# Patient Record
Sex: Female | Born: 2000 | Race: White | Hispanic: No | Marital: Single | Smoking: Never smoker
Health system: Southern US, Community
[De-identification: ages and names within clinical notes are randomized; demographics above are authoritative.]

## PROBLEM LIST (undated history)

## (undated) DIAGNOSIS — L509 Urticaria, unspecified: Secondary | ICD-10-CM

## (undated) DIAGNOSIS — R42 Dizziness and giddiness: Secondary | ICD-10-CM

## (undated) DIAGNOSIS — L700 Acne vulgaris: Secondary | ICD-10-CM

## (undated) DIAGNOSIS — F32A Depression, unspecified: Secondary | ICD-10-CM

## (undated) DIAGNOSIS — F419 Anxiety disorder, unspecified: Secondary | ICD-10-CM

## (undated) DIAGNOSIS — IMO0001 Reserved for inherently not codable concepts without codable children: Secondary | ICD-10-CM

## (undated) DIAGNOSIS — G43009 Migraine without aura, not intractable, without status migrainosus: Secondary | ICD-10-CM

## (undated) DIAGNOSIS — Z5189 Encounter for other specified aftercare: Secondary | ICD-10-CM

## (undated) DIAGNOSIS — F988 Other specified behavioral and emotional disorders with onset usually occurring in childhood and adolescence: Secondary | ICD-10-CM

## (undated) DIAGNOSIS — F509 Eating disorder, unspecified: Secondary | ICD-10-CM

## (undated) HISTORY — DX: Other specified behavioral and emotional disorders with onset usually occurring in childhood and adolescence: F98.8

## (undated) HISTORY — PX: WISDOM TOOTH EXTRACTION: SHX21

## (undated) HISTORY — DX: Dizziness and giddiness: R42

## (undated) HISTORY — DX: Anxiety disorder, unspecified: F41.9

## (undated) HISTORY — DX: Migraine without aura, not intractable, without status migrainosus: G43.009

## (undated) HISTORY — DX: Eating disorder, unspecified: F50.9

## (undated) HISTORY — DX: Acne vulgaris: L70.0

## (undated) HISTORY — DX: Depression, unspecified: F32.A

## (undated) HISTORY — DX: Urticaria, unspecified: L50.9

---
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
## 2018-04-05 ENCOUNTER — Telehealth: Payer: Self-pay

## 2018-04-05 NOTE — Telephone Encounter (Signed)
Faxed notes to nl 

## 2018-05-02 ENCOUNTER — Ambulatory Visit: Payer: Self-pay | Admitting: Cardiovascular Disease

## 2019-02-23 ENCOUNTER — Ambulatory Visit (INDEPENDENT_AMBULATORY_CARE_PROVIDER_SITE_OTHER): Payer: Medicaid Other | Admitting: Podiatry

## 2019-02-23 ENCOUNTER — Other Ambulatory Visit: Payer: Self-pay | Admitting: Podiatry

## 2019-02-23 ENCOUNTER — Ambulatory Visit (INDEPENDENT_AMBULATORY_CARE_PROVIDER_SITE_OTHER): Payer: Medicaid Other

## 2019-02-23 ENCOUNTER — Other Ambulatory Visit: Payer: Self-pay

## 2019-02-23 ENCOUNTER — Encounter: Payer: Self-pay | Admitting: Podiatry

## 2019-02-23 DIAGNOSIS — M2141 Flat foot [pes planus] (acquired), right foot: Secondary | ICD-10-CM

## 2019-02-23 DIAGNOSIS — M2142 Flat foot [pes planus] (acquired), left foot: Secondary | ICD-10-CM | POA: Diagnosis not present

## 2019-02-23 DIAGNOSIS — M79676 Pain in unspecified toe(s): Secondary | ICD-10-CM

## 2019-02-23 DIAGNOSIS — M79671 Pain in right foot: Secondary | ICD-10-CM

## 2019-02-23 MED ORDER — DICLOFENAC SODIUM 75 MG PO TBEC: 75.0000 mg | DELAYED_RELEASE_TABLET | Freq: Two times a day (BID) | ORAL | 1 refills | Status: AC

## 2019-02-25 NOTE — Progress Notes (Signed)
   Subjective:  18 y.o. female presenting today as a new patient with a chief complaint of bilateral flat feet that have been symptomatic for the past 1-2 years. She states the pain is worse after being on her feet all day at work. She reports associated swelling and redness of the feet. She even reports aching pain at night while sleeping. She has tried OTC insoles that have not helped provide any relief. She denies any alleviating factors. Patient is here for further evaluation and treatment.   Past Medical History:  Diagnosis Date  . Blood transfusion        Objective/Physical Exam General: The patient is alert and oriented x3 in no acute distress.  Dermatology: Skin is warm, dry and supple bilateral lower extremities. Negative for open lesions or macerations.  Vascular: Bilateral lower extremity edema noted. Palpable pedal pulses bilaterally. No erythema noted. Capillary refill within normal limits.  Neurological: Epicritic and protective threshold grossly intact bilaterally.   Musculoskeletal Exam: Range of motion within normal limits to all pedal and ankle joints bilateral. Muscle strength 5/5 in all groups bilateral.  Upon weightbearing there is a medial longitudinal arch collapse bilaterally. Remove foot valgus noted to the bilateral lower extremities with excessive pronation upon mid stance.  Radiographic Exam:  Normal osseous mineralization. Joint spaces preserved. No fracture/dislocation/boney destruction.   Pes planus noted on radiographic exam lateral views. Decreased calcaneal inclination and metatarsal declination angle is noted. Anterior break in the cyma line noted on lateral views. Medial talar head to deviation noted on AP radiograph.   Assessment: 1. pes planus bilateral 2. BLE edema 3. Generalized foot pain bilaterally   Plan of Care:  1. Patient was evaluated. X-Rays reviewed.  2. Appointment with Liliane Channel, Pedorthist, for custom molded orthotics.  3.  Prescription for Diclofenac provided to patient. 4. Compression anklet dispensed.  5. Return to clinic as needed.   Works at [employer].    Edrick Kins, DPM [facility]  Dr. Edrick Kins, [place]                                        [place], [place] [address]                Office [phone]  Fax [phone]

## 2019-03-05 DIAGNOSIS — R42 Dizziness and giddiness: Secondary | ICD-10-CM | POA: Diagnosis not present

## 2019-03-05 DIAGNOSIS — R0789 Other chest pain: Secondary | ICD-10-CM | POA: Diagnosis not present

## 2019-03-06 DIAGNOSIS — Z20828 Contact with and (suspected) exposure to other viral communicable diseases: Secondary | ICD-10-CM | POA: Diagnosis not present

## 2019-03-15 DIAGNOSIS — U071 COVID-19: Secondary | ICD-10-CM | POA: Diagnosis not present

## 2019-03-21 ENCOUNTER — Other Ambulatory Visit: Payer: Medicaid Other | Admitting: Orthotics

## 2019-04-04 ENCOUNTER — Other Ambulatory Visit: Payer: Medicaid Other | Admitting: Orthotics

## 2019-04-20 ENCOUNTER — Emergency Department
Admission: EM | Admit: 2019-04-20 | Discharge: 2019-04-20 | Disposition: A | Discharge status: Home / Self Care | Payer: Medicaid Other | Attending: Student | Admitting: Student

## 2019-04-20 ENCOUNTER — Other Ambulatory Visit: Payer: Self-pay

## 2019-04-20 ENCOUNTER — Encounter: Payer: Self-pay | Admitting: Emergency Medicine

## 2019-04-20 ENCOUNTER — Emergency Department: Payer: Medicaid Other

## 2019-04-20 DIAGNOSIS — R109 Unspecified abdominal pain: Secondary | ICD-10-CM

## 2019-04-20 DIAGNOSIS — R102 Pelvic and perineal pain: Secondary | ICD-10-CM

## 2019-04-20 DIAGNOSIS — N8302 Follicular cyst of left ovary: Secondary | ICD-10-CM | POA: Diagnosis not present

## 2019-04-20 DIAGNOSIS — R1032 Left lower quadrant pain: Secondary | ICD-10-CM | POA: Diagnosis not present

## 2019-04-20 DIAGNOSIS — M25552 Pain in left hip: Secondary | ICD-10-CM | POA: Diagnosis not present

## 2019-04-20 DIAGNOSIS — N8301 Follicular cyst of right ovary: Secondary | ICD-10-CM | POA: Diagnosis not present

## 2019-04-20 DIAGNOSIS — R1110 Vomiting, unspecified: Secondary | ICD-10-CM | POA: Diagnosis not present

## 2019-04-20 DIAGNOSIS — Z793 Long term (current) use of hormonal contraceptives: Secondary | ICD-10-CM | POA: Diagnosis not present

## 2019-04-20 LAB — URINALYSIS, COMPLETE (UACMP) WITH MICROSCOPIC
Bilirubin Urine: NEGATIVE
Glucose, UA: NEGATIVE mg/dL
Hgb urine dipstick: NEGATIVE
Ketones, ur: NEGATIVE mg/dL
Leukocytes,Ua: NEGATIVE
Nitrite: NEGATIVE
Protein, ur: NEGATIVE mg/dL
Specific Gravity, Urine: 1.015 (ref 1.005–1.030)
pH: 7 (ref 5.0–8.0)

## 2019-04-20 LAB — POCT PREGNANCY, URINE: Preg Test, Ur: NEGATIVE

## 2019-04-20 MED ORDER — MELOXICAM 15 MG PO TABS: 15.0000 mg | ORAL_TABLET | Freq: Every day | ORAL | 2 refills | Status: AC

## 2019-04-20 MED ORDER — ONDANSETRON 8 MG PO TBDP
8.0000 mg | ORAL_TABLET | Freq: Once | ORAL | Status: AC
  Administered 2019-04-20: 8 mg via ORAL
  Filled 2019-04-20: qty 1

## 2019-04-20 MED ORDER — BACLOFEN 10 MG PO TABS: 10.0000 mg | ORAL_TABLET | Freq: Every day | ORAL | 1 refills | Status: AC

## 2019-04-20 NOTE — ED Notes (Signed)
See triage note  States she developed slight fever this am  Took some tylenol  Then had sharp pain to left hip area which moved into groin area    States the pain was severe enough to make her light headed  Denies trauma or urinary sx's

## 2019-04-20 NOTE — Discharge Instructions (Addendum)
Follow-up with your regular doctor if not improving in 3 to 4 days.  Return emergency department if worsening.  Take the meloxicam daily , Do not take the diclofenac with this medication,  You should also not take any Other anti-inflammatory such as Aleve or ibuprofen. If you start to see blood in your urine please return the emergency department.

## 2019-04-20 NOTE — ED Triage Notes (Signed)
Pt reports was baking in her kitchen and suddenly has some pain to the left side of her back and left hip. Pt reports the pain was so severe it made her lightheaded. Pt reports now pain is intermittent and sharp in nature. Pt reports has family hx of PCOS and endometriosis so she called her MD and they advised her to come to the ED and be checked out due to her family hx. Pt denies fevers, N, D.

## 2019-04-20 NOTE — ED Provider Notes (Signed)
[facility] Emergency Department Provider Note  ____________________________________________   First MD Initiated Contact with Patient 04/20/19 1418     (approximate)  I have reviewed the triage vital signs and the nursing notes.   HISTORY  Chief Complaint Back Pain and Hip Pain    HPI Andrea Green is a 19 y.o. female presents emergency department complaint of left lower quadrant pain.  Sudden onset today which made her drop to her knees and she vomited due to the pain.  She called her regular doctor who told her to come here as he is worried about a ovarian torsion or hemorrhagic cyst.  She states she is on the Nexplanon.  No regular periods.  No concerns for pregnancy.  No fever or chills.  Pain has dissipated somewhat now that she is here in the ED but is still uncomfortable.  She denies any vaginal discharge.  No blood in the urine.    Past Medical History:  Diagnosis Date  . Blood transfusion     Patient Active Problem List   Diagnosis Date Noted  . Episodic mood disorder (HCC) 07/24/2011  . Major depressive disorder, single episode 04/08/2011  . Chest pain 11/18/2010    Past Surgical History:  Procedure Laterality Date  . WISDOM TOOTH EXTRACTION      Prior to Admission medications   Medication Sig Start Date End Date Taking? Authorizing Provider  albuterol (PROVENTIL HFA;VENTOLIN HFA) 108 (90 Base) MCG/ACT inhaler Inhale 1-2 puffs into the lungs every 4 (four) hours as needed for wheezing or shortness of breath.    Emergency, Nurse, RN  baclofen (LIORESAL) 10 MG tablet Take 1 tablet (10 mg total) by mouth daily. 04/20/19 04/19/20  Faythe Ghee, PA-C  diclofenac (VOLTAREN) 75 MG EC tablet Take 1 tablet (75 mg total) by mouth 2 (two) times daily. 02/23/19   Felecia Shelling, DPM  etonogestrel (NEXPLANON) 68 MG IMPL implant 1 each by Subdermal route once.    Emergency, Nurse, RN  meloxicam (MOBIC) 15 MG tablet Take 1 tablet (15 mg total) by  mouth daily. 04/20/19 04/19/20  Faythe Ghee, PA-C    Allergies Aripiprazole, Doxycycline, and Zolpidem  History reviewed. No pertinent family history.  Social History Social History   Tobacco Use  . Smoking status: Never Smoker  . Smokeless tobacco: Never Used  Substance Use Topics  . Alcohol use: No  . Drug use: No    Review of Systems  Constitutional: No fever/chills Eyes: No visual changes. ENT: No sore throat. Respiratory: Denies cough Cardiovascular: Denies chest pain Gastrointestinal: Positive llq abdominal pain Genitourinary: Negative for dysuria.  Denies vaginal discharge Musculoskeletal: Negative for back pain. Skin: Negative for rash. Psychiatric: no mood changes,     ____________________________________________   PHYSICAL EXAM:  VITAL SIGNS: ED Triage Vitals  Enc Vitals Group     BP 04/20/19 1315 (!) 157/92     Pulse Rate 04/20/19 1315 98     Resp 04/20/19 1315 20     Temp 04/20/19 1315 98.4 F (36.9 C)     Temp Source 04/20/19 1315 Oral     SpO2 04/20/19 1315 99 %     Weight 04/20/19 1314 225 lb (102.1 kg)     Height 04/20/19 1314 5\' 3"  (1.6 m)     Head Circumference --      Peak Flow --      Pain Score 04/20/19 1313 8     Pain Loc --  Pain Edu? --      Excl. in GC? --     Constitutional: Alert and oriented. Well appearing and in no acute distress. Eyes: Conjunctivae are normal.  Head: Atraumatic. Nose: No congestion/rhinnorhea. Mouth/Throat: Mucous membranes are moist.   Neck:  supple no lymphadenopathy noted Cardiovascular: Normal rate, regular rhythm.  Respiratory: Normal respiratory effort.  No retractions,  Abd: soft tender in the left lower quadrant, bs normal all 4 quad GU: deferred Musculoskeletal: FROM all extremities, warm and well perfused Neurologic:  Normal speech and language.  Skin:  Skin is warm, dry and intact. No rash noted. Psychiatric: Mood and affect are normal. Speech and behavior are  normal.  ____________________________________________   LABS (all labs ordered are listed, but only abnormal results are displayed)  Labs Reviewed  URINALYSIS, COMPLETE (UACMP) WITH MICROSCOPIC - Abnormal; Notable for the following components:      Result Value   Color, Urine YELLOW (*)    APPearance HAZY (*)    Bacteria, UA RARE (*)    All other components within normal limits  POC URINE PREG, ED  POCT PREGNANCY, URINE   ____________________________________________   ____________________________________________  RADIOLOGY  Ultrasound pelvis pelvis with Doppler is negative  ____________________________________________   PROCEDURES  Procedure(s) performed: No  Procedures    ____________________________________________   INITIAL IMPRESSION / ASSESSMENT AND PLAN / ED COURSE  Pertinent labs & imaging results that were available during my care of the patient were reviewed by me and considered in my medical decision making (see chart for details).   Patient is 19 year old female presents emergency department with left lower quadrant pain.  See HPI  Physical exam shows patient appears well.  Left lower quadrant is tender to palpation.  UA, POC pregnancy Ultrasound pelvis, ultrasound pelvis with Doppler to rule out hemorrhagic/ torsion    Ultrasound is negative.  Explained findings to the patient.  She was given a prescription for meloxicam and baclofen.  She is to stop taking the diclofenac.  Return to the emergency department if worsening.  Follow-up with her regular doctor if not improving in 3 to 4 days.  She states she understands will comply.  She was discharged stable condition and given a work note.  Andrea Green was evaluated in Emergency Department on 04/20/2019 for the symptoms described in the history of present illness. She was evaluated in the context of the global COVID-19 pandemic, which necessitated consideration that the patient might be at risk for  infection with the SARS-CoV-2 virus that causes COVID-19. Institutional protocols and algorithms that pertain to the evaluation of patients at risk for COVID-19 are in a state of rapid change based on information released by regulatory bodies including the CDC and federal and state organizations. These policies and algorithms were followed during the patient's care in the ED.   As part of my medical decision making, I reviewed the following data within the electronic MEDICAL RECORD NUMBER Nursing notes reviewed and incorporated, Labs reviewed UA and poc preg negative, Old chart reviewed, Radiograph reviewed still negative, Notes from prior ED visits and [place] Controlled Substance Database  ____________________________________________   FINAL CLINICAL IMPRESSION(S) / ED DIAGNOSES  Final diagnoses:  LLQ pain  Acute flank pain      NEW MEDICATIONS STARTED DURING THIS VISIT:  New Prescriptions   BACLOFEN (LIORESAL) 10 MG TABLET    Take 1 tablet (10 mg total) by mouth daily.   MELOXICAM (MOBIC) 15 MG TABLET    Take 1 tablet (15 mg  total) by mouth daily.     Note:  This document was prepared using Dragon voice recognition software and may include unintentional dictation errors.    Versie Starks, PA-C 04/20/19 1657    Lilia Pro., MD 04/21/19 [phone]

## 2019-04-24 DIAGNOSIS — M545 Low back pain: Secondary | ICD-10-CM | POA: Diagnosis not present

## 2019-04-24 DIAGNOSIS — R1110 Vomiting, unspecified: Secondary | ICD-10-CM | POA: Diagnosis not present

## 2019-04-24 DIAGNOSIS — R1031 Right lower quadrant pain: Secondary | ICD-10-CM | POA: Diagnosis not present

## 2019-04-24 DIAGNOSIS — F329 Major depressive disorder, single episode, unspecified: Secondary | ICD-10-CM | POA: Diagnosis not present

## 2019-04-24 DIAGNOSIS — N200 Calculus of kidney: Secondary | ICD-10-CM | POA: Diagnosis not present

## 2019-04-24 DIAGNOSIS — R1033 Periumbilical pain: Secondary | ICD-10-CM | POA: Diagnosis not present

## 2019-04-24 DIAGNOSIS — R1013 Epigastric pain: Secondary | ICD-10-CM | POA: Diagnosis not present

## 2019-04-24 DIAGNOSIS — N132 Hydronephrosis with renal and ureteral calculous obstruction: Secondary | ICD-10-CM | POA: Diagnosis not present

## 2019-04-24 DIAGNOSIS — F909 Attention-deficit hyperactivity disorder, unspecified type: Secondary | ICD-10-CM | POA: Diagnosis not present

## 2019-04-24 DIAGNOSIS — R10819 Abdominal tenderness, unspecified site: Secondary | ICD-10-CM | POA: Diagnosis not present

## 2019-04-24 DIAGNOSIS — F419 Anxiety disorder, unspecified: Secondary | ICD-10-CM | POA: Diagnosis not present

## 2019-04-26 DIAGNOSIS — F411 Generalized anxiety disorder: Secondary | ICD-10-CM | POA: Diagnosis not present

## 2019-05-03 DIAGNOSIS — F411 Generalized anxiety disorder: Secondary | ICD-10-CM | POA: Diagnosis not present

## 2019-05-10 DIAGNOSIS — F603 Borderline personality disorder: Secondary | ICD-10-CM | POA: Diagnosis not present

## 2019-05-11 DIAGNOSIS — N200 Calculus of kidney: Secondary | ICD-10-CM | POA: Diagnosis not present

## 2019-05-22 DIAGNOSIS — F603 Borderline personality disorder: Secondary | ICD-10-CM | POA: Diagnosis not present

## 2019-05-23 DIAGNOSIS — F603 Borderline personality disorder: Secondary | ICD-10-CM | POA: Diagnosis not present

## 2019-05-28 DIAGNOSIS — N1330 Unspecified hydronephrosis: Secondary | ICD-10-CM | POA: Diagnosis not present

## 2019-05-28 DIAGNOSIS — N200 Calculus of kidney: Secondary | ICD-10-CM | POA: Diagnosis not present

## 2019-05-28 DIAGNOSIS — N134 Hydroureter: Secondary | ICD-10-CM | POA: Diagnosis not present

## 2019-05-28 DIAGNOSIS — N2889 Other specified disorders of kidney and ureter: Secondary | ICD-10-CM | POA: Diagnosis not present

## 2019-05-28 DIAGNOSIS — N132 Hydronephrosis with renal and ureteral calculous obstruction: Secondary | ICD-10-CM | POA: Diagnosis not present

## 2019-06-01 DIAGNOSIS — F603 Borderline personality disorder: Secondary | ICD-10-CM | POA: Diagnosis not present

## 2019-06-02 DIAGNOSIS — Z01812 Encounter for preprocedural laboratory examination: Secondary | ICD-10-CM | POA: Diagnosis not present

## 2019-06-02 DIAGNOSIS — Z20822 Contact with and (suspected) exposure to covid-19: Secondary | ICD-10-CM | POA: Diagnosis not present

## 2019-06-04 DIAGNOSIS — F329 Major depressive disorder, single episode, unspecified: Secondary | ICD-10-CM | POA: Diagnosis not present

## 2019-06-04 DIAGNOSIS — N135 Crossing vessel and stricture of ureter without hydronephrosis: Secondary | ICD-10-CM | POA: Diagnosis not present

## 2019-06-04 DIAGNOSIS — D72829 Elevated white blood cell count, unspecified: Secondary | ICD-10-CM | POA: Diagnosis not present

## 2019-06-04 DIAGNOSIS — N201 Calculus of ureter: Secondary | ICD-10-CM | POA: Diagnosis not present

## 2019-06-04 DIAGNOSIS — N200 Calculus of kidney: Secondary | ICD-10-CM | POA: Diagnosis not present

## 2019-06-07 DIAGNOSIS — F603 Borderline personality disorder: Secondary | ICD-10-CM | POA: Diagnosis not present

## 2019-06-09 DIAGNOSIS — R110 Nausea: Secondary | ICD-10-CM | POA: Diagnosis not present

## 2019-06-09 DIAGNOSIS — T82847A Pain from cardiac prosthetic devices, implants and grafts, initial encounter: Secondary | ICD-10-CM | POA: Diagnosis not present

## 2019-06-09 DIAGNOSIS — F909 Attention-deficit hyperactivity disorder, unspecified type: Secondary | ICD-10-CM | POA: Diagnosis not present

## 2019-06-09 DIAGNOSIS — N132 Hydronephrosis with renal and ureteral calculous obstruction: Secondary | ICD-10-CM | POA: Diagnosis not present

## 2019-06-09 DIAGNOSIS — R1031 Right lower quadrant pain: Secondary | ICD-10-CM | POA: Diagnosis not present

## 2019-06-09 DIAGNOSIS — I10 Essential (primary) hypertension: Secondary | ICD-10-CM | POA: Diagnosis not present

## 2019-06-09 DIAGNOSIS — R319 Hematuria, unspecified: Secondary | ICD-10-CM | POA: Diagnosis not present

## 2019-06-09 DIAGNOSIS — R1011 Right upper quadrant pain: Secondary | ICD-10-CM | POA: Diagnosis not present

## 2019-06-14 DIAGNOSIS — F603 Borderline personality disorder: Secondary | ICD-10-CM | POA: Diagnosis not present

## 2019-06-28 DIAGNOSIS — F603 Borderline personality disorder: Secondary | ICD-10-CM | POA: Diagnosis not present

## 2019-07-05 DIAGNOSIS — F603 Borderline personality disorder: Secondary | ICD-10-CM | POA: Diagnosis not present

## 2019-07-12 DIAGNOSIS — F603 Borderline personality disorder: Secondary | ICD-10-CM | POA: Diagnosis not present

## 2019-07-18 DIAGNOSIS — F603 Borderline personality disorder: Secondary | ICD-10-CM | POA: Diagnosis not present

## 2019-07-26 DIAGNOSIS — F603 Borderline personality disorder: Secondary | ICD-10-CM | POA: Diagnosis not present

## 2019-07-30 DIAGNOSIS — Z87442 Personal history of urinary calculi: Secondary | ICD-10-CM | POA: Diagnosis not present

## 2019-07-30 DIAGNOSIS — N200 Calculus of kidney: Secondary | ICD-10-CM | POA: Diagnosis not present

## 2019-07-30 DIAGNOSIS — R5383 Other fatigue: Secondary | ICD-10-CM | POA: Diagnosis not present

## 2019-08-03 DIAGNOSIS — Z23 Encounter for immunization: Secondary | ICD-10-CM | POA: Diagnosis not present

## 2019-08-09 DIAGNOSIS — F603 Borderline personality disorder: Secondary | ICD-10-CM | POA: Diagnosis not present

## 2019-08-10 DIAGNOSIS — F603 Borderline personality disorder: Secondary | ICD-10-CM | POA: Diagnosis not present

## 2019-08-12 DIAGNOSIS — F603 Borderline personality disorder: Secondary | ICD-10-CM | POA: Diagnosis not present

## 2019-08-16 DIAGNOSIS — F331 Major depressive disorder, recurrent, moderate: Secondary | ICD-10-CM | POA: Diagnosis not present

## 2019-08-16 DIAGNOSIS — F603 Borderline personality disorder: Secondary | ICD-10-CM | POA: Diagnosis not present

## 2019-08-16 DIAGNOSIS — F411 Generalized anxiety disorder: Secondary | ICD-10-CM | POA: Diagnosis not present

## 2019-08-16 DIAGNOSIS — F4310 Post-traumatic stress disorder, unspecified: Secondary | ICD-10-CM | POA: Diagnosis not present

## 2019-08-30 DIAGNOSIS — F4310 Post-traumatic stress disorder, unspecified: Secondary | ICD-10-CM | POA: Diagnosis not present

## 2019-08-30 DIAGNOSIS — F331 Major depressive disorder, recurrent, moderate: Secondary | ICD-10-CM | POA: Diagnosis not present

## 2019-08-30 DIAGNOSIS — F603 Borderline personality disorder: Secondary | ICD-10-CM | POA: Diagnosis not present

## 2019-08-30 DIAGNOSIS — F411 Generalized anxiety disorder: Secondary | ICD-10-CM | POA: Diagnosis not present

## 2019-08-31 DIAGNOSIS — Z23 Encounter for immunization: Secondary | ICD-10-CM | POA: Diagnosis not present

## 2019-09-06 DIAGNOSIS — F4310 Post-traumatic stress disorder, unspecified: Secondary | ICD-10-CM | POA: Diagnosis not present

## 2019-09-06 DIAGNOSIS — F411 Generalized anxiety disorder: Secondary | ICD-10-CM | POA: Diagnosis not present

## 2019-09-06 DIAGNOSIS — F603 Borderline personality disorder: Secondary | ICD-10-CM | POA: Diagnosis not present

## 2019-09-06 DIAGNOSIS — F331 Major depressive disorder, recurrent, moderate: Secondary | ICD-10-CM | POA: Diagnosis not present

## 2019-09-13 DIAGNOSIS — F411 Generalized anxiety disorder: Secondary | ICD-10-CM | POA: Diagnosis not present

## 2019-09-13 DIAGNOSIS — F603 Borderline personality disorder: Secondary | ICD-10-CM | POA: Diagnosis not present

## 2019-09-13 DIAGNOSIS — F4310 Post-traumatic stress disorder, unspecified: Secondary | ICD-10-CM | POA: Diagnosis not present

## 2019-09-13 DIAGNOSIS — F331 Major depressive disorder, recurrent, moderate: Secondary | ICD-10-CM | POA: Diagnosis not present

## 2019-09-20 DIAGNOSIS — F603 Borderline personality disorder: Secondary | ICD-10-CM | POA: Diagnosis not present

## 2019-09-20 DIAGNOSIS — F411 Generalized anxiety disorder: Secondary | ICD-10-CM | POA: Diagnosis not present

## 2019-09-20 DIAGNOSIS — F4310 Post-traumatic stress disorder, unspecified: Secondary | ICD-10-CM | POA: Diagnosis not present

## 2019-09-20 DIAGNOSIS — F331 Major depressive disorder, recurrent, moderate: Secondary | ICD-10-CM | POA: Diagnosis not present

## 2019-10-02 DIAGNOSIS — F4310 Post-traumatic stress disorder, unspecified: Secondary | ICD-10-CM | POA: Diagnosis not present

## 2019-10-02 DIAGNOSIS — F411 Generalized anxiety disorder: Secondary | ICD-10-CM | POA: Diagnosis not present

## 2019-10-02 DIAGNOSIS — F331 Major depressive disorder, recurrent, moderate: Secondary | ICD-10-CM | POA: Diagnosis not present

## 2019-10-02 DIAGNOSIS — F603 Borderline personality disorder: Secondary | ICD-10-CM | POA: Diagnosis not present

## 2019-10-04 DIAGNOSIS — F4310 Post-traumatic stress disorder, unspecified: Secondary | ICD-10-CM | POA: Diagnosis not present

## 2019-10-04 DIAGNOSIS — F411 Generalized anxiety disorder: Secondary | ICD-10-CM | POA: Diagnosis not present

## 2019-10-04 DIAGNOSIS — F331 Major depressive disorder, recurrent, moderate: Secondary | ICD-10-CM | POA: Diagnosis not present

## 2019-10-04 DIAGNOSIS — F603 Borderline personality disorder: Secondary | ICD-10-CM | POA: Diagnosis not present

## 2019-10-11 DIAGNOSIS — F411 Generalized anxiety disorder: Secondary | ICD-10-CM | POA: Diagnosis not present

## 2019-10-11 DIAGNOSIS — F331 Major depressive disorder, recurrent, moderate: Secondary | ICD-10-CM | POA: Diagnosis not present

## 2019-10-11 DIAGNOSIS — F4310 Post-traumatic stress disorder, unspecified: Secondary | ICD-10-CM | POA: Diagnosis not present

## 2019-10-11 DIAGNOSIS — F603 Borderline personality disorder: Secondary | ICD-10-CM | POA: Diagnosis not present

## 2019-10-18 DIAGNOSIS — F331 Major depressive disorder, recurrent, moderate: Secondary | ICD-10-CM | POA: Diagnosis not present

## 2019-10-18 DIAGNOSIS — F603 Borderline personality disorder: Secondary | ICD-10-CM | POA: Diagnosis not present

## 2019-10-18 DIAGNOSIS — F4310 Post-traumatic stress disorder, unspecified: Secondary | ICD-10-CM | POA: Diagnosis not present

## 2019-10-18 DIAGNOSIS — F411 Generalized anxiety disorder: Secondary | ICD-10-CM | POA: Diagnosis not present

## 2019-10-25 DIAGNOSIS — F411 Generalized anxiety disorder: Secondary | ICD-10-CM | POA: Diagnosis not present

## 2019-10-25 DIAGNOSIS — F331 Major depressive disorder, recurrent, moderate: Secondary | ICD-10-CM | POA: Diagnosis not present

## 2019-10-25 DIAGNOSIS — F603 Borderline personality disorder: Secondary | ICD-10-CM | POA: Diagnosis not present

## 2019-10-25 DIAGNOSIS — F4310 Post-traumatic stress disorder, unspecified: Secondary | ICD-10-CM | POA: Diagnosis not present

## 2019-10-29 DIAGNOSIS — F411 Generalized anxiety disorder: Secondary | ICD-10-CM | POA: Diagnosis not present

## 2019-10-29 DIAGNOSIS — F4310 Post-traumatic stress disorder, unspecified: Secondary | ICD-10-CM | POA: Diagnosis not present

## 2019-10-29 DIAGNOSIS — F331 Major depressive disorder, recurrent, moderate: Secondary | ICD-10-CM | POA: Diagnosis not present

## 2019-10-29 DIAGNOSIS — F988 Other specified behavioral and emotional disorders with onset usually occurring in childhood and adolescence: Secondary | ICD-10-CM | POA: Diagnosis not present

## 2019-10-29 DIAGNOSIS — F603 Borderline personality disorder: Secondary | ICD-10-CM | POA: Diagnosis not present

## 2019-11-01 DIAGNOSIS — F331 Major depressive disorder, recurrent, moderate: Secondary | ICD-10-CM | POA: Diagnosis not present

## 2019-11-01 DIAGNOSIS — F411 Generalized anxiety disorder: Secondary | ICD-10-CM | POA: Diagnosis not present

## 2019-11-01 DIAGNOSIS — F4310 Post-traumatic stress disorder, unspecified: Secondary | ICD-10-CM | POA: Diagnosis not present

## 2019-11-01 DIAGNOSIS — F603 Borderline personality disorder: Secondary | ICD-10-CM | POA: Diagnosis not present

## 2019-11-04 ENCOUNTER — Encounter (HOSPITAL_COMMUNITY): Payer: Self-pay

## 2019-11-04 ENCOUNTER — Ambulatory Visit (HOSPITAL_COMMUNITY)
Admission: EM | Admit: 2019-11-04 | Discharge: 2019-11-04 | Disposition: A | Discharge status: Home / Self Care | Payer: Medicaid Other

## 2019-11-04 ENCOUNTER — Emergency Department (HOSPITAL_COMMUNITY)
Admission: EM | Admit: 2019-11-04 | Discharge: 2019-11-04 | Disposition: A | Discharge status: Home / Self Care | Payer: Medicaid Other | Attending: Emergency Medicine | Admitting: Emergency Medicine

## 2019-11-04 ENCOUNTER — Emergency Department (HOSPITAL_COMMUNITY): Payer: Medicaid Other

## 2019-11-04 ENCOUNTER — Other Ambulatory Visit: Payer: Self-pay

## 2019-11-04 DIAGNOSIS — S161XXA Strain of muscle, fascia and tendon at neck level, initial encounter: Secondary | ICD-10-CM | POA: Diagnosis not present

## 2019-11-04 DIAGNOSIS — Y939 Activity, unspecified: Secondary | ICD-10-CM | POA: Diagnosis not present

## 2019-11-04 DIAGNOSIS — S199XXA Unspecified injury of neck, initial encounter: Secondary | ICD-10-CM | POA: Diagnosis present

## 2019-11-04 DIAGNOSIS — R030 Elevated blood-pressure reading, without diagnosis of hypertension: Secondary | ICD-10-CM

## 2019-11-04 DIAGNOSIS — R519 Headache, unspecified: Secondary | ICD-10-CM | POA: Diagnosis not present

## 2019-11-04 DIAGNOSIS — Y92410 Unspecified street and highway as the place of occurrence of the external cause: Secondary | ICD-10-CM | POA: Diagnosis not present

## 2019-11-04 DIAGNOSIS — S060X0A Concussion without loss of consciousness, initial encounter: Secondary | ICD-10-CM

## 2019-11-04 DIAGNOSIS — Y999 Unspecified external cause status: Secondary | ICD-10-CM | POA: Insufficient documentation

## 2019-11-04 DIAGNOSIS — M542 Cervicalgia: Secondary | ICD-10-CM | POA: Diagnosis not present

## 2019-11-04 MED ORDER — IBUPROFEN 200 MG PO TABS
400.0000 mg | ORAL_TABLET | Freq: Once | ORAL | Status: AC
  Administered 2019-11-04: 400 mg via ORAL
  Filled 2019-11-04: qty 2

## 2019-11-04 MED ORDER — ONDANSETRON 8 MG PO TBDP: 8.0000 mg | ORAL_TABLET | Freq: Three times a day (TID) | ORAL | 0 refills | Status: AC | PRN

## 2019-11-04 MED ORDER — METHOCARBAMOL 750 MG PO TABS: 750.0000 mg | ORAL_TABLET | Freq: Three times a day (TID) | ORAL | 0 refills | Status: AC | PRN

## 2019-11-04 NOTE — Discharge Instructions (Addendum)
It was our pleasure to provide your ER care today - we hope that you feel better.  Your ct scans look good.   Rest. Drink plenty of fluids. Take acetaminophen or ibuprofen as need. You may also try robaxin as need for muscle pain/spasm - no driving when taking. Take zofran as need for nausea.   No contact sports until cleared to do so by your doctor.   Follow up with primary care doctor in 1-2 weeks for recheck - also have your blood pressure rechecked then as it is high today.  Return to ER if worse, new symptoms, fevers, new or severe pain, numbness/weakness, or other concern.
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
## 2019-11-04 NOTE — ED Notes (Signed)
Patient is being discharged from the Urgent Care and sent to the Emergency Department via POV. Per Marilynn Rail patient is in need of higher level of care due to Possible head injury. Patient is aware and verbalizes understanding of plan of care.  Vitals:   11/04/19 1341  BP: (!) 141/81  Pulse: (!) 108  Resp: 18  Temp: 98.2 F (36.8 C)  SpO2: 99%
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
## 2019-11-13 DIAGNOSIS — F331 Major depressive disorder, recurrent, moderate: Secondary | ICD-10-CM | POA: Diagnosis not present

## 2019-11-13 DIAGNOSIS — F603 Borderline personality disorder: Secondary | ICD-10-CM | POA: Diagnosis not present

## 2019-11-13 DIAGNOSIS — F411 Generalized anxiety disorder: Secondary | ICD-10-CM | POA: Diagnosis not present

## 2019-11-13 DIAGNOSIS — F4310 Post-traumatic stress disorder, unspecified: Secondary | ICD-10-CM | POA: Diagnosis not present

## 2019-11-20 DIAGNOSIS — F4310 Post-traumatic stress disorder, unspecified: Secondary | ICD-10-CM | POA: Diagnosis not present

## 2019-11-20 DIAGNOSIS — F331 Major depressive disorder, recurrent, moderate: Secondary | ICD-10-CM | POA: Diagnosis not present

## 2019-11-20 DIAGNOSIS — F411 Generalized anxiety disorder: Secondary | ICD-10-CM | POA: Diagnosis not present

## 2019-11-20 DIAGNOSIS — F603 Borderline personality disorder: Secondary | ICD-10-CM | POA: Diagnosis not present

## 2019-11-27 DIAGNOSIS — F411 Generalized anxiety disorder: Secondary | ICD-10-CM | POA: Diagnosis not present

## 2019-11-27 DIAGNOSIS — F4310 Post-traumatic stress disorder, unspecified: Secondary | ICD-10-CM | POA: Diagnosis not present

## 2019-11-27 DIAGNOSIS — F331 Major depressive disorder, recurrent, moderate: Secondary | ICD-10-CM | POA: Diagnosis not present

## 2019-11-27 DIAGNOSIS — F603 Borderline personality disorder: Secondary | ICD-10-CM | POA: Diagnosis not present

## 2019-12-04 DIAGNOSIS — F411 Generalized anxiety disorder: Secondary | ICD-10-CM | POA: Diagnosis not present

## 2019-12-04 DIAGNOSIS — F4310 Post-traumatic stress disorder, unspecified: Secondary | ICD-10-CM | POA: Diagnosis not present

## 2019-12-04 DIAGNOSIS — F331 Major depressive disorder, recurrent, moderate: Secondary | ICD-10-CM | POA: Diagnosis not present

## 2019-12-04 DIAGNOSIS — F603 Borderline personality disorder: Secondary | ICD-10-CM | POA: Diagnosis not present

## 2019-12-06 DIAGNOSIS — F331 Major depressive disorder, recurrent, moderate: Secondary | ICD-10-CM | POA: Diagnosis not present

## 2019-12-06 DIAGNOSIS — F411 Generalized anxiety disorder: Secondary | ICD-10-CM | POA: Diagnosis not present

## 2019-12-06 DIAGNOSIS — F4310 Post-traumatic stress disorder, unspecified: Secondary | ICD-10-CM | POA: Diagnosis not present

## 2019-12-06 DIAGNOSIS — F603 Borderline personality disorder: Secondary | ICD-10-CM | POA: Diagnosis not present

## 2019-12-11 DIAGNOSIS — F4310 Post-traumatic stress disorder, unspecified: Secondary | ICD-10-CM | POA: Diagnosis not present

## 2019-12-11 DIAGNOSIS — F331 Major depressive disorder, recurrent, moderate: Secondary | ICD-10-CM | POA: Diagnosis not present

## 2019-12-11 DIAGNOSIS — F509 Eating disorder, unspecified: Secondary | ICD-10-CM | POA: Diagnosis not present

## 2019-12-11 DIAGNOSIS — F411 Generalized anxiety disorder: Secondary | ICD-10-CM | POA: Diagnosis not present

## 2019-12-11 DIAGNOSIS — F603 Borderline personality disorder: Secondary | ICD-10-CM | POA: Diagnosis not present

## 2019-12-18 DIAGNOSIS — F331 Major depressive disorder, recurrent, moderate: Secondary | ICD-10-CM | POA: Diagnosis not present

## 2019-12-18 DIAGNOSIS — F4310 Post-traumatic stress disorder, unspecified: Secondary | ICD-10-CM | POA: Diagnosis not present

## 2019-12-18 DIAGNOSIS — F509 Eating disorder, unspecified: Secondary | ICD-10-CM | POA: Diagnosis not present

## 2019-12-18 DIAGNOSIS — F411 Generalized anxiety disorder: Secondary | ICD-10-CM | POA: Diagnosis not present

## 2019-12-18 DIAGNOSIS — F603 Borderline personality disorder: Secondary | ICD-10-CM | POA: Diagnosis not present

## 2019-12-25 DIAGNOSIS — F331 Major depressive disorder, recurrent, moderate: Secondary | ICD-10-CM | POA: Diagnosis not present

## 2019-12-25 DIAGNOSIS — F411 Generalized anxiety disorder: Secondary | ICD-10-CM | POA: Diagnosis not present

## 2019-12-25 DIAGNOSIS — F4310 Post-traumatic stress disorder, unspecified: Secondary | ICD-10-CM | POA: Diagnosis not present

## 2019-12-25 DIAGNOSIS — F603 Borderline personality disorder: Secondary | ICD-10-CM | POA: Diagnosis not present

## 2019-12-25 DIAGNOSIS — F509 Eating disorder, unspecified: Secondary | ICD-10-CM | POA: Diagnosis not present

## 2020-01-01 DIAGNOSIS — F411 Generalized anxiety disorder: Secondary | ICD-10-CM | POA: Diagnosis not present

## 2020-01-01 DIAGNOSIS — F331 Major depressive disorder, recurrent, moderate: Secondary | ICD-10-CM | POA: Diagnosis not present

## 2020-01-01 DIAGNOSIS — F4310 Post-traumatic stress disorder, unspecified: Secondary | ICD-10-CM | POA: Diagnosis not present

## 2020-01-01 DIAGNOSIS — F603 Borderline personality disorder: Secondary | ICD-10-CM | POA: Diagnosis not present

## 2020-01-01 DIAGNOSIS — F509 Eating disorder, unspecified: Secondary | ICD-10-CM | POA: Diagnosis not present

## 2020-01-18 DIAGNOSIS — F4310 Post-traumatic stress disorder, unspecified: Secondary | ICD-10-CM | POA: Diagnosis not present

## 2020-01-18 DIAGNOSIS — F411 Generalized anxiety disorder: Secondary | ICD-10-CM | POA: Diagnosis not present

## 2020-01-18 DIAGNOSIS — F603 Borderline personality disorder: Secondary | ICD-10-CM | POA: Diagnosis not present

## 2020-01-18 DIAGNOSIS — F509 Eating disorder, unspecified: Secondary | ICD-10-CM | POA: Diagnosis not present

## 2020-01-18 DIAGNOSIS — F331 Major depressive disorder, recurrent, moderate: Secondary | ICD-10-CM | POA: Diagnosis not present

## 2020-01-22 DIAGNOSIS — F509 Eating disorder, unspecified: Secondary | ICD-10-CM | POA: Diagnosis not present

## 2020-01-22 DIAGNOSIS — F331 Major depressive disorder, recurrent, moderate: Secondary | ICD-10-CM | POA: Diagnosis not present

## 2020-01-22 DIAGNOSIS — F603 Borderline personality disorder: Secondary | ICD-10-CM | POA: Diagnosis not present

## 2020-01-22 DIAGNOSIS — F4310 Post-traumatic stress disorder, unspecified: Secondary | ICD-10-CM | POA: Diagnosis not present

## 2020-01-22 DIAGNOSIS — F411 Generalized anxiety disorder: Secondary | ICD-10-CM | POA: Diagnosis not present

## 2020-01-25 DIAGNOSIS — F4310 Post-traumatic stress disorder, unspecified: Secondary | ICD-10-CM | POA: Diagnosis not present

## 2020-01-25 DIAGNOSIS — F509 Eating disorder, unspecified: Secondary | ICD-10-CM | POA: Diagnosis not present

## 2020-01-25 DIAGNOSIS — F603 Borderline personality disorder: Secondary | ICD-10-CM | POA: Diagnosis not present

## 2020-01-25 DIAGNOSIS — F331 Major depressive disorder, recurrent, moderate: Secondary | ICD-10-CM | POA: Diagnosis not present

## 2020-01-25 DIAGNOSIS — F411 Generalized anxiety disorder: Secondary | ICD-10-CM | POA: Diagnosis not present

## 2020-02-05 DIAGNOSIS — F603 Borderline personality disorder: Secondary | ICD-10-CM | POA: Diagnosis not present

## 2020-02-05 DIAGNOSIS — F509 Eating disorder, unspecified: Secondary | ICD-10-CM | POA: Diagnosis not present

## 2020-02-05 DIAGNOSIS — F4310 Post-traumatic stress disorder, unspecified: Secondary | ICD-10-CM | POA: Diagnosis not present

## 2020-02-05 DIAGNOSIS — F411 Generalized anxiety disorder: Secondary | ICD-10-CM | POA: Diagnosis not present

## 2020-02-05 DIAGNOSIS — F331 Major depressive disorder, recurrent, moderate: Secondary | ICD-10-CM | POA: Diagnosis not present

## 2020-02-22 DIAGNOSIS — F331 Major depressive disorder, recurrent, moderate: Secondary | ICD-10-CM | POA: Diagnosis not present

## 2020-02-22 DIAGNOSIS — F4310 Post-traumatic stress disorder, unspecified: Secondary | ICD-10-CM | POA: Diagnosis not present

## 2020-02-22 DIAGNOSIS — F603 Borderline personality disorder: Secondary | ICD-10-CM | POA: Diagnosis not present

## 2020-02-22 DIAGNOSIS — F509 Eating disorder, unspecified: Secondary | ICD-10-CM | POA: Diagnosis not present

## 2020-02-22 DIAGNOSIS — F411 Generalized anxiety disorder: Secondary | ICD-10-CM | POA: Diagnosis not present

## 2020-02-24 DIAGNOSIS — F4310 Post-traumatic stress disorder, unspecified: Secondary | ICD-10-CM | POA: Diagnosis not present

## 2020-02-24 DIAGNOSIS — F411 Generalized anxiety disorder: Secondary | ICD-10-CM | POA: Diagnosis not present

## 2020-02-24 DIAGNOSIS — F603 Borderline personality disorder: Secondary | ICD-10-CM | POA: Diagnosis not present

## 2020-02-24 DIAGNOSIS — F331 Major depressive disorder, recurrent, moderate: Secondary | ICD-10-CM | POA: Diagnosis not present

## 2020-02-24 DIAGNOSIS — F509 Eating disorder, unspecified: Secondary | ICD-10-CM | POA: Diagnosis not present

## 2020-02-26 DIAGNOSIS — F4310 Post-traumatic stress disorder, unspecified: Secondary | ICD-10-CM | POA: Diagnosis not present

## 2020-02-26 DIAGNOSIS — F331 Major depressive disorder, recurrent, moderate: Secondary | ICD-10-CM | POA: Diagnosis not present

## 2020-02-26 DIAGNOSIS — F411 Generalized anxiety disorder: Secondary | ICD-10-CM | POA: Diagnosis not present

## 2020-02-26 DIAGNOSIS — F509 Eating disorder, unspecified: Secondary | ICD-10-CM | POA: Diagnosis not present

## 2020-03-04 DIAGNOSIS — F331 Major depressive disorder, recurrent, moderate: Secondary | ICD-10-CM | POA: Diagnosis not present

## 2020-03-04 DIAGNOSIS — F509 Eating disorder, unspecified: Secondary | ICD-10-CM | POA: Diagnosis not present

## 2020-03-04 DIAGNOSIS — F4310 Post-traumatic stress disorder, unspecified: Secondary | ICD-10-CM | POA: Diagnosis not present

## 2020-03-04 DIAGNOSIS — F603 Borderline personality disorder: Secondary | ICD-10-CM | POA: Diagnosis not present

## 2020-03-04 DIAGNOSIS — F411 Generalized anxiety disorder: Secondary | ICD-10-CM | POA: Diagnosis not present

## 2020-03-12 DIAGNOSIS — F603 Borderline personality disorder: Secondary | ICD-10-CM | POA: Diagnosis not present

## 2020-03-12 DIAGNOSIS — F411 Generalized anxiety disorder: Secondary | ICD-10-CM | POA: Diagnosis not present

## 2020-03-12 DIAGNOSIS — F509 Eating disorder, unspecified: Secondary | ICD-10-CM | POA: Diagnosis not present

## 2020-03-12 DIAGNOSIS — F331 Major depressive disorder, recurrent, moderate: Secondary | ICD-10-CM | POA: Diagnosis not present

## 2020-03-12 DIAGNOSIS — F4310 Post-traumatic stress disorder, unspecified: Secondary | ICD-10-CM | POA: Diagnosis not present

## 2020-04-26 DIAGNOSIS — J029 Acute pharyngitis, unspecified: Secondary | ICD-10-CM | POA: Diagnosis not present

## 2020-04-30 ENCOUNTER — Encounter: Payer: Medicaid Other | Admitting: Orthotics

## 2020-05-05 ENCOUNTER — Other Ambulatory Visit: Payer: Self-pay

## 2020-05-05 ENCOUNTER — Emergency Department
Admission: EM | Admit: 2020-05-05 | Discharge: 2020-05-06 | Disposition: A | Discharge status: Home / Self Care | Payer: Medicaid Other | Attending: Emergency Medicine | Admitting: Emergency Medicine

## 2020-05-05 DIAGNOSIS — Z975 Presence of (intrauterine) contraceptive device: Secondary | ICD-10-CM | POA: Diagnosis not present

## 2020-05-05 DIAGNOSIS — N939 Abnormal uterine and vaginal bleeding, unspecified: Secondary | ICD-10-CM | POA: Insufficient documentation

## 2020-05-05 DIAGNOSIS — F17290 Nicotine dependence, other tobacco product, uncomplicated: Secondary | ICD-10-CM | POA: Insufficient documentation

## 2020-05-05 LAB — URINALYSIS, ROUTINE W REFLEX MICROSCOPIC
Bacteria, UA: NONE SEEN
Bilirubin Urine: NEGATIVE
Glucose, UA: NEGATIVE mg/dL
Ketones, ur: NEGATIVE mg/dL
Leukocytes,Ua: NEGATIVE
Nitrite: NEGATIVE
Protein, ur: NEGATIVE mg/dL
Specific Gravity, Urine: 1.015 (ref 1.005–1.030)
pH: 5 (ref 5.0–8.0)

## 2020-05-05 LAB — BASIC METABOLIC PANEL
Anion gap: 9 (ref 5–15)
BUN: 9 mg/dL (ref 6–20)
CO2: 25 mmol/L (ref 22–32)
Calcium: 9.4 mg/dL (ref 8.9–10.3)
Chloride: 103 mmol/L (ref 98–111)
Creatinine, Ser: 0.64 mg/dL (ref 0.44–1.00)
GFR, Estimated: >60 mL/min (ref >60)
Glucose, Bld: 107 mg/dL — ABNORMAL HIGH (ref 70–99)
Potassium: 3.6 mmol/L (ref 3.5–5.1)
Sodium: 137 mmol/L (ref 135–145)

## 2020-05-05 LAB — CBC
HCT: 39.5 % (ref 36.0–46.0)
Hemoglobin: 13 g/dL (ref 12.0–15.0)
MCH: 27.6 pg (ref 26.0–34.0)
MCHC: 32.9 g/dL (ref 30.0–36.0)
MCV: 83.9 fL (ref 80.0–100.0)
Platelets: 332 10*3/uL (ref 150–400)
RBC: 4.71 MIL/uL (ref 3.87–5.11)
RDW: 12.8 % (ref 11.5–15.5)
WBC: 11.2 10*3/uL — ABNORMAL HIGH (ref 4.0–10.5)
nRBC: 0 % (ref 0.0–0.2)

## 2020-05-05 LAB — POC URINE PREG, ED: Preg Test, Ur: NEGATIVE

## 2020-05-05 MED ORDER — ACETAMINOPHEN 500 MG PO TABS
1000.0000 mg | ORAL_TABLET | Freq: Once | ORAL | Status: AC
  Administered 2020-05-06: 1000 mg via ORAL
  Filled 2020-05-05: qty 2

## 2020-05-05 MED ORDER — DROPERIDOL 2.5 MG/ML IJ SOLN
1.2500 mg | Freq: Once | INTRAMUSCULAR | Status: AC
  Administered 2020-05-06: 1.25 mg via INTRAMUSCULAR
  Filled 2020-05-05: qty 2

## 2020-05-05 MED ORDER — NAPROXEN 500 MG PO TABS
500.0000 mg | ORAL_TABLET | Freq: Once | ORAL | Status: AC
  Administered 2020-05-06: 500 mg via ORAL
  Filled 2020-05-05: qty 1

## 2020-05-05 NOTE — ED Provider Notes (Signed)
[facility] Emergency Department Provider Note ____________________________________________   Event Date/Time   First MD Initiated Contact with Patient 05/05/20 2305     (approximate)  I have reviewed the triage vital signs and the nursing notes.  HISTORY  Chief Complaint No chief complaint on file.   HPI Andrea Green is a 20 y.o. femalewho presents to the ED for evaluation of vaginal bleeding.   Chart review indicates no relevant medical hx.  Patient reports a Nexplanon in place that is nearly 22 years old.  G0.  Patient presents to the ED due to 5 episodes of vaginal bleeding in the past 1.5 months.  She reports that each episode is similar to her menstrual periods in the past that she had prior to the Nexplanon placement, which she reports history of menometrorrhagia that has been improved with Nexplanon in place.  She reports currently bleeding and "being on my menstrual period," with this episode lasting for the past 2-3 days.  She reports going through a tampon every 2 hours.  She reports presyncope without syncope.  Denies any fevers, vaginal discharge beyond the bleeding, emesis, dysuria, diarrhea.  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   Past Medical History:  Diagnosis Date  . Blood transfusion     Patient Active Problem List   Diagnosis Date Noted  . Episodic mood disorder (HCC) 07/24/2011  . Major depressive disorder, single episode 04/08/2011  . Chest pain 11/18/2010    Past Surgical History:  Procedure Laterality Date  . WISDOM TOOTH EXTRACTION      Prior to Admission medications   Medication Sig Start Date End Date Taking? Authorizing Provider  ondansetron (ZOFRAN ODT) 4 MG disintegrating tablet Take 1 tablet (4 mg total) by mouth every 8 (eight)  hours as needed for nausea or vomiting. 05/06/20  Yes Delton Prairie, MD  albuterol (PROVENTIL HFA;VENTOLIN HFA) 108 (90 Base) MCG/ACT inhaler Inhale 1-2 puffs into the lungs every 4 (four) hours as needed for wheezing or shortness of breath.    Emergency, Nurse, RN  diclofenac (VOLTAREN) 75 MG EC tablet Take 1 tablet (75 mg total) by mouth 2 (two) times daily. 02/23/19   Felecia Shelling, DPM  etonogestrel (NEXPLANON) 68 MG IMPL implant 1 each by Subdermal route once.    Emergency, Nurse, RN  methocarbamol (ROBAXIN) 750 MG tablet Take 1 tablet (750 mg total) by mouth 3 (three) times daily as needed (muscle spasm/pain). 11/04/19   Cathren Laine, MD  ondansetron (ZOFRAN ODT) 8 MG disintegrating tablet Take 1 tablet (8 mg total) by mouth every 8 (eight) hours as needed for nausea or vomiting. 11/04/19   Cathren Laine, MD    Allergies Aripiprazole, Doxycycline, Gluten meal, and Zolpidem  No family history on file.  Social History Social History   Tobacco Use  . Smoking status: Never Smoker  . Smokeless tobacco: Never Used  Vaping Use  . Vaping Use: Every day  Substance Use Topics  . Alcohol use: Yes  . Drug use: No    Review of Systems  Constitutional: No fever/chills Eyes: No visual changes. ENT: No sore throat. Cardiovascular: Denies chest pain. Respiratory: Denies shortness of breath. Gastrointestinal: No abdominal pain.  No nausea, no vomiting.  No diarrhea.  No constipation. Genitourinary: Negative for dysuria.  Positive for vaginal bleeding.  Musculoskeletal: Negative for back pain. Skin: Negative for rash. Neurological: Negative for headaches, focal weakness or numbness.  ____________________________________________   PHYSICAL EXAM:  VITAL SIGNS: Vitals:   05/05/20 1920 05/05/20 2247  BP: (!) 157/109 (!) 143/97  Pulse: (!) 108 90  Resp: 16 20  Temp: 98.3 F (36.8 C)   SpO2: 97% 100%     Constitutional: Alert and oriented. Well appearing and in no acute distress.   Obese.  Pleasant and conversational in full sentences. Eyes: Conjunctivae are normal. PERRL. EOMI. Head: Atraumatic. Nose: No congestion/rhinnorhea. Mouth/Throat: Mucous membranes are moist.  Oropharynx non-erythematous. Neck: No stridor. No cervical spine tenderness to palpation. Cardiovascular: Normal rate, regular rhythm. Grossly normal heart sounds.  Good peripheral circulation. Respiratory: Normal respiratory effort.  No retractions. Lungs CTAB. Gastrointestinal: Soft , nondistended. No CVA tenderness. Mild and poorly localizing diffuse abdominal tenderness to palpation without peritoneal features. GU examination deferred per patient's request Musculoskeletal: No lower extremity tenderness nor edema.  No joint effusions. No signs of acute trauma. Neurologic:  Normal speech and language. No gross focal neurologic deficits are appreciated. No gait instability noted. Skin:  Skin is warm, dry and intact. No rash noted. Psychiatric: Mood and affect are normal. Speech and behavior are normal. ____________________________________________   LABS (all labs ordered are listed, but only abnormal results are displayed)  Labs Reviewed  CBC - Abnormal; Notable for the following components:      Result Value   WBC 11.2 (*)    All other components within normal limits  BASIC METABOLIC PANEL - Abnormal; Notable for the following components:   Glucose, Bld 107 (*)    All other components within normal limits  URINALYSIS, ROUTINE W REFLEX MICROSCOPIC - Abnormal; Notable for the following components:   Color, Urine STRAW (*)    APPearance CLEAR (*)    Hgb urine dipstick MODERATE (*)    All other components within normal limits  POC URINE PREG, ED   ____________________________________________   PROCEDURES and INTERVENTIONS  Procedure(s) performed (including Critical Care):  .1-3 Lead EKG Interpretation Performed by: Delton Prairie, MD Authorized by: Delton Prairie, MD     Interpretation:  normal     ECG rate:  94   ECG rate assessment: normal     Rhythm: sinus rhythm     Ectopy: none     Conduction: normal      Medications  acetaminophen (TYLENOL) tablet 1,000 mg (1,000 mg Oral Given 05/06/20 0022)  naproxen (NAPROSYN) tablet 500 mg (500 mg Oral Given 05/06/20 0023)  droperidol (INAPSINE) 2.5 MG/ML injection 1.25 mg (1.25 mg Intramuscular Given 05/06/20 0023)    ____________________________________________   MDM / ED COURSE   20 year old female presents to the ED with abnormal uterine bleeding amenable to outpatient management.  Hemodynamically stable and looks well on examination.  She has some poorly localizing and mild abdominal tenderness throughout, but no peritoneal features or anything localizing.  Blood work is reassuring with stable hemoglobin.  Patient is not pregnant.  Urine without infectious features, and she has no symptoms of UTI.  Patient reports resolving symptoms after NSAIDs, Tylenol and droperidol.  She is refusing swabbing for gonorrhea and chlamydia, and certainly does not look to be acutely septic or to have evidence of PID.  I urged patient to follow-up with OB/GYN, information was provided to do so.  We discussed return precautions for the ED and patient is medically stable for outpatient management.   Clinical Course as of 05/06/20 0109  Mon May 05, 2020  2354 Reassessed.  Patient sitting up in bed and on the phone with her aunt, tearful because she "does not know why her body is doing this to her."  We discussed possible etiologies of her bleeding.  We further discussed the possibility of an STD such as gonorrhea, chlamydia causing bleeding without additional discharge changes.  She refuses swabbing for this as she has not been sexually active for 3 years, she claims.  We discussed untreated infections like to make her more sick, she reports that she does not want to be tested for these. [DS]  Tue May 06, 2020  0105 Reassessed.  Patient reports  improving symptoms.  We discussed following up with OB/GYN and we discussed return precautions for the ED.  We discussed pain control at home with OTC medications and we discussed the addition of Zofran to her regimen.  Answered questions.  Examination of her abdomen reveals benign exam. [DS]    Clinical Course User Index [DS] Delton Prairie, MD    ____________________________________________   FINAL CLINICAL IMPRESSION(S) / ED DIAGNOSES  Final diagnoses:  Abnormal uterine bleeding     ED Discharge Orders         Ordered    ondansetron (ZOFRAN ODT) 4 MG disintegrating tablet  Every 8 hours PRN        05/06/20 0106           Derrious Bologna   Note:  This document was prepared using Dragon voice recognition software and may include unintentional dictation errors.   Delton Prairie, MD 05/06/20 [phone]

## 2020-05-05 NOTE — ED Triage Notes (Signed)
Pt states she has been having heavy vaginal bleeding x2 days. Pt states she has the nexplanon birth control in her arm and hasn't had a period in the past few years but starting in December has started having periods again, bleeding lasts 4-5 days and pt has had her period 5 times since early January. Each time pt states the heaviness of bleeding has increased. Pt is now wearing adult diapers and soaking through them, her pants and her sheets on her bed. Pt states she has lower back and abd cramping, as well as she has noticed her hair falling out. Pt was told she may have PCOS in the past but never diagnosed with it.

## 2020-05-06 MED ORDER — ONDANSETRON 4 MG PO TBDP: 4.0000 mg | ORAL_TABLET | Freq: Three times a day (TID) | ORAL | 0 refills | Status: AC | PRN

## 2020-05-06 NOTE — ED Notes (Signed)
Late entry -- Pt reports pelvic pain radiating to the bilat flanks described as cramping.  Reports heavy vaginal bleeding requiring pads be changed every 1 -2 hrs.  H&H stable/VSS.

## 2020-05-06 NOTE — ED Notes (Signed)
Pt agreeable with d/c plan as discussed by provider- this nurse has reviewed d/c instructions and provided pt with written copy - pt acknowledges verbal understanding and denies any additional questions, concerns, needs.  Ambulatory at discharge independently with steady gait.  No acute distress.

## 2020-05-06 NOTE — Discharge Instructions (Signed)
Please take Tylenol and ibuprofen/Advil for your pain.  It is safe to take them together, or to alternate them every few hours.  Take up to 1000mg  of Tylenol at a time, up to 4 times per day.  Do not take more than 4000 mg of Tylenol in 24 hours.  For ibuprofen, take 400-600 mg, 4-5 times per day.  As we discussed, please follow-up with the local OB/GYN.  If you have significantly worsening of your bleeding, particularly with passing out all the way, severe pains or fevers, please return to the ED.

## 2020-05-09 ENCOUNTER — Encounter: Payer: Self-pay | Admitting: Obstetrics and Gynecology

## 2020-05-09 ENCOUNTER — Other Ambulatory Visit: Payer: Self-pay

## 2020-05-09 ENCOUNTER — Ambulatory Visit (INDEPENDENT_AMBULATORY_CARE_PROVIDER_SITE_OTHER): Payer: Medicaid Other | Admitting: Obstetrics and Gynecology

## 2020-05-09 VITALS — BP 120/70 | Ht 63.0 in | Wt 219.6 lb

## 2020-05-09 DIAGNOSIS — Z30011 Encounter for initial prescription of contraceptive pills: Secondary | ICD-10-CM

## 2020-05-09 MED ORDER — NORETHIN ACE-ETH ESTRAD-FE 1-20 MG-MCG PO TABS: 1.0000 | ORAL_TABLET | Freq: Every day | ORAL | 11 refills | Status: AC
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
## 2020-05-09 NOTE — Patient Instructions (Signed)
Oral Contraception Information Oral contraceptive pills (OCPs) are medicines taken by mouth to prevent pregnancy. They work by:  Preventing the ovaries from releasing eggs.  Thickening mucus in the lower part of the uterus (cervix). This prevents sperm from entering the uterus.  Thinning the lining of the uterus (endometrium). This prevents a fertilized egg from attaching to the endometrium. OCPs are highly effective when taken exactly as prescribed. However, OCPs do not prevent STIs (sexually transmitted infections). Using condoms while on an OCP can help prevent STIs. What happens before starting OCPs? Before you start taking OCPs:  You may have a physical exam, blood test, and Pap test.  Your health care provider will make sure you are a good candidate for oral contraception. OCPs are not a good option for certain women, such as: ? Women who smoke and are older than age 35. ? Women who have or have had certain conditions, such as:  A history of high blood pressure.  Deep vein thrombosis.  Pulmonary embolism.  Stroke.  Cardiovascular disease.  Peripheral vascular disease. Ask your health care provider about the possible side effects of the OCP you may be prescribed. Be aware that it can take 2-3 months for your body to adjust to changes in hormone levels. Types of oral contraception Birth control pills contain the hormones estrogen and progestin (synthetic progesterone) or progestin only. The combination pill This type of pill contains estrogen and progestin hormones.  Conventional contraception pills come in packs of 21 or 28 pills. ? Some packs with 28-day pills contain estrogen and progestin for the first 21-24 days. Hormone-free tablets, called placebos, are taken for the final 4-7 days. You should have menstrual bleeding during the time you take the placebos. ? In packs with 21 tablets, you take no pills for 7 days. Menstrual bleeding occurs during these days. (Some people  prefer taking a pill for 28 days to help establish a routine).  Extended-interval contraception pills come in packs of 91 pills. The first 84 tablets have both estrogen and progestin. The last 7 pills are placebos. Menstrual bleeding occurs during the placebo days. With this schedule, menstrual bleeding happens once every 3 months.  Continuous contraception pills come in packs of 28 pills. All pills in the pack contain estrogen and progestin. With this schedule, regular menstrual bleeding does not happen, but there may be spotting or irregular bleeding. Progestin-only pills This type of pill is often called the mini-pill and contains the progestin hormone only. It comes in packs of 28 pills. In some packs, the last 4 pills are placebos. The pill must be taken at the same time every day. This is very important to prevent pregnancy. Menstrual bleeding may not be regular or predictable.   What are the advantages? Oral contraception provides reliable and continuous contraception if taken as directed. It may treat or decrease symptoms of:  Menstrual period cramps.  Irregular menstrual cycle or bleeding.  Heavy menstrual flow.  Abnormal uterine bleeding.  Acne, depending on the type of pill.  Polycystic ovarian syndrome (POS).  Endometriosis.  Iron deficiency anemia.  Premenstrual symptoms, including severe irritability, depression, or anxiety. It also may:  Reduce the risk of endometrial and ovarian cancer.  Be used as emergency contraception.  Prevent ectopic pregnancies and infections of the fallopian tubes. What can make OCPs less effective? OCPs may be less effective if:  You forget to take the pill every day. For progestin-only pills, it is especially important to take the pill at the   same time each day. Even taking it 3 hours late can increase the risk of pregnancy.  You have a stomach or intestinal disease that reduces your body's ability to absorb the pill.  You take OCPs  with other medicines that make OCPs less effective, such as antibiotics, certain HIV medicines, and some seizure medicines.  You take expired OCPs.  You forget to restart the pill after 7 days of not taking it. This refers to the packs of 21 pills. What are the side effects and risks? OCPs can sometimes cause side effects, such as:  Headache.  Depression.  Trouble sleeping.  Nausea and vomiting.  Breast tenderness.  Irregular bleeding or spotting during the first several months.  Bloating or fluid retention.  Increase in blood pressure. Combination pills may slightly increase the risk of:  Blood clots.  Heart attack.  Stroke. Follow these instructions at home: Follow instructions from your health care provider about how to start taking your first cycle of OCPs. Depending on when you start the pill, you may need to use a backup form of birth control, such as condoms, during the first week. Make sure you know what steps to take if you forget to take the pill. Summary  Oral contraceptive pills (OCPs) are medicines taken by mouth to prevent pregnancy. They are highly effective when taken exactly as prescribed.  OCPs contain a combination of the hormones estrogen and progestin (synthetic progesterone) or progestin only.  Before you start taking the pill, you may have a physical exam, blood test, and Pap test. Your health care provider will make sure you are a good candidate for oral contraception.  The combination pill may come in a 21-day pack, a 28-day pack, or a 91-day pack. Progestin-only pills come in packs of 28 pills.  OCPs can sometimes cause side effects, such as headache, nausea, breast tenderness, or irregular bleeding. This information is not intended to replace advice given to you by your health care provider. Make sure you discuss any questions you have with your health care provider. Document Revised: 11/30/2019 Document Reviewed: 11/08/2019 Elsevier Patient  Education  2021 Elsevier Inc.  

## 2020-05-16 DIAGNOSIS — M7712 Lateral epicondylitis, left elbow: Secondary | ICD-10-CM | POA: Diagnosis not present

## 2020-05-26 DIAGNOSIS — F331 Major depressive disorder, recurrent, moderate: Secondary | ICD-10-CM | POA: Diagnosis not present

## 2020-05-26 DIAGNOSIS — F509 Eating disorder, unspecified: Secondary | ICD-10-CM | POA: Diagnosis not present

## 2020-05-26 DIAGNOSIS — F4310 Post-traumatic stress disorder, unspecified: Secondary | ICD-10-CM | POA: Diagnosis not present

## 2020-05-26 DIAGNOSIS — F411 Generalized anxiety disorder: Secondary | ICD-10-CM | POA: Diagnosis not present

## 2020-05-26 DIAGNOSIS — F603 Borderline personality disorder: Secondary | ICD-10-CM | POA: Diagnosis not present

## 2020-06-11 DIAGNOSIS — Z20822 Contact with and (suspected) exposure to covid-19: Secondary | ICD-10-CM | POA: Diagnosis not present

## 2020-07-07 ENCOUNTER — Ambulatory Visit: Payer: Medicaid Other | Admitting: Obstetrics and Gynecology

## 2020-07-16 DIAGNOSIS — F603 Borderline personality disorder: Secondary | ICD-10-CM | POA: Diagnosis not present

## 2020-07-16 DIAGNOSIS — F4310 Post-traumatic stress disorder, unspecified: Secondary | ICD-10-CM | POA: Diagnosis not present

## 2020-07-16 DIAGNOSIS — F331 Major depressive disorder, recurrent, moderate: Secondary | ICD-10-CM | POA: Diagnosis not present

## 2020-07-16 DIAGNOSIS — F509 Eating disorder, unspecified: Secondary | ICD-10-CM | POA: Diagnosis not present

## 2020-07-16 DIAGNOSIS — F411 Generalized anxiety disorder: Secondary | ICD-10-CM | POA: Diagnosis not present

## 2020-07-21 DIAGNOSIS — F603 Borderline personality disorder: Secondary | ICD-10-CM | POA: Diagnosis not present

## 2020-07-21 DIAGNOSIS — F331 Major depressive disorder, recurrent, moderate: Secondary | ICD-10-CM | POA: Diagnosis not present

## 2020-07-21 DIAGNOSIS — F509 Eating disorder, unspecified: Secondary | ICD-10-CM | POA: Diagnosis not present

## 2020-07-21 DIAGNOSIS — F4310 Post-traumatic stress disorder, unspecified: Secondary | ICD-10-CM | POA: Diagnosis not present

## 2020-07-21 DIAGNOSIS — F411 Generalized anxiety disorder: Secondary | ICD-10-CM | POA: Diagnosis not present

## 2020-07-22 DIAGNOSIS — Z1329 Encounter for screening for other suspected endocrine disorder: Secondary | ICD-10-CM | POA: Diagnosis not present

## 2020-07-22 DIAGNOSIS — Z1389 Encounter for screening for other disorder: Secondary | ICD-10-CM | POA: Diagnosis not present

## 2020-07-22 DIAGNOSIS — R42 Dizziness and giddiness: Secondary | ICD-10-CM | POA: Diagnosis not present

## 2020-07-22 DIAGNOSIS — E162 Hypoglycemia, unspecified: Secondary | ICD-10-CM | POA: Diagnosis not present

## 2020-07-28 DIAGNOSIS — F411 Generalized anxiety disorder: Secondary | ICD-10-CM | POA: Diagnosis not present

## 2020-07-28 DIAGNOSIS — F509 Eating disorder, unspecified: Secondary | ICD-10-CM | POA: Diagnosis not present

## 2020-07-28 DIAGNOSIS — F603 Borderline personality disorder: Secondary | ICD-10-CM | POA: Diagnosis not present

## 2020-07-28 DIAGNOSIS — F331 Major depressive disorder, recurrent, moderate: Secondary | ICD-10-CM | POA: Diagnosis not present

## 2020-07-28 DIAGNOSIS — F4310 Post-traumatic stress disorder, unspecified: Secondary | ICD-10-CM | POA: Diagnosis not present

## 2020-08-07 DIAGNOSIS — F603 Borderline personality disorder: Secondary | ICD-10-CM | POA: Diagnosis not present

## 2020-08-07 DIAGNOSIS — F4310 Post-traumatic stress disorder, unspecified: Secondary | ICD-10-CM | POA: Diagnosis not present

## 2020-08-07 DIAGNOSIS — F411 Generalized anxiety disorder: Secondary | ICD-10-CM | POA: Diagnosis not present

## 2020-08-07 DIAGNOSIS — F331 Major depressive disorder, recurrent, moderate: Secondary | ICD-10-CM | POA: Diagnosis not present

## 2020-08-07 DIAGNOSIS — F509 Eating disorder, unspecified: Secondary | ICD-10-CM | POA: Diagnosis not present

## 2020-08-07 DIAGNOSIS — Z20822 Contact with and (suspected) exposure to covid-19: Secondary | ICD-10-CM | POA: Diagnosis not present

## 2020-08-07 DIAGNOSIS — R509 Fever, unspecified: Secondary | ICD-10-CM | POA: Diagnosis not present

## 2020-08-07 DIAGNOSIS — K529 Noninfective gastroenteritis and colitis, unspecified: Secondary | ICD-10-CM | POA: Diagnosis not present

## 2020-08-11 DIAGNOSIS — F509 Eating disorder, unspecified: Secondary | ICD-10-CM | POA: Diagnosis not present

## 2020-08-11 DIAGNOSIS — F603 Borderline personality disorder: Secondary | ICD-10-CM | POA: Diagnosis not present

## 2020-08-11 DIAGNOSIS — F4310 Post-traumatic stress disorder, unspecified: Secondary | ICD-10-CM | POA: Diagnosis not present

## 2020-08-11 DIAGNOSIS — F411 Generalized anxiety disorder: Secondary | ICD-10-CM | POA: Diagnosis not present

## 2020-08-11 DIAGNOSIS — F331 Major depressive disorder, recurrent, moderate: Secondary | ICD-10-CM | POA: Diagnosis not present

## 2020-08-12 DIAGNOSIS — U071 COVID-19: Secondary | ICD-10-CM | POA: Diagnosis not present

## 2020-08-20 DIAGNOSIS — F4310 Post-traumatic stress disorder, unspecified: Secondary | ICD-10-CM | POA: Diagnosis not present

## 2020-08-20 DIAGNOSIS — F509 Eating disorder, unspecified: Secondary | ICD-10-CM | POA: Diagnosis not present

## 2020-08-20 DIAGNOSIS — F331 Major depressive disorder, recurrent, moderate: Secondary | ICD-10-CM | POA: Diagnosis not present

## 2020-08-20 DIAGNOSIS — F411 Generalized anxiety disorder: Secondary | ICD-10-CM | POA: Diagnosis not present

## 2020-08-20 DIAGNOSIS — F603 Borderline personality disorder: Secondary | ICD-10-CM | POA: Diagnosis not present

## 2020-08-27 DIAGNOSIS — F603 Borderline personality disorder: Secondary | ICD-10-CM | POA: Diagnosis not present

## 2020-08-27 DIAGNOSIS — F411 Generalized anxiety disorder: Secondary | ICD-10-CM | POA: Diagnosis not present

## 2020-08-27 DIAGNOSIS — F331 Major depressive disorder, recurrent, moderate: Secondary | ICD-10-CM | POA: Diagnosis not present

## 2020-08-27 DIAGNOSIS — F4310 Post-traumatic stress disorder, unspecified: Secondary | ICD-10-CM | POA: Diagnosis not present

## 2020-08-27 DIAGNOSIS — F509 Eating disorder, unspecified: Secondary | ICD-10-CM | POA: Diagnosis not present

## 2020-09-03 DIAGNOSIS — F4310 Post-traumatic stress disorder, unspecified: Secondary | ICD-10-CM | POA: Diagnosis not present

## 2020-09-03 DIAGNOSIS — F331 Major depressive disorder, recurrent, moderate: Secondary | ICD-10-CM | POA: Diagnosis not present

## 2020-09-03 DIAGNOSIS — F509 Eating disorder, unspecified: Secondary | ICD-10-CM | POA: Diagnosis not present

## 2020-09-03 DIAGNOSIS — F603 Borderline personality disorder: Secondary | ICD-10-CM | POA: Diagnosis not present

## 2020-09-03 DIAGNOSIS — F411 Generalized anxiety disorder: Secondary | ICD-10-CM | POA: Diagnosis not present

## 2020-09-10 DIAGNOSIS — F603 Borderline personality disorder: Secondary | ICD-10-CM | POA: Diagnosis not present

## 2020-09-10 DIAGNOSIS — F4310 Post-traumatic stress disorder, unspecified: Secondary | ICD-10-CM | POA: Diagnosis not present

## 2020-09-10 DIAGNOSIS — F509 Eating disorder, unspecified: Secondary | ICD-10-CM | POA: Diagnosis not present

## 2020-09-10 DIAGNOSIS — F331 Major depressive disorder, recurrent, moderate: Secondary | ICD-10-CM | POA: Diagnosis not present

## 2020-09-10 DIAGNOSIS — F411 Generalized anxiety disorder: Secondary | ICD-10-CM | POA: Diagnosis not present

## 2020-10-01 DIAGNOSIS — F411 Generalized anxiety disorder: Secondary | ICD-10-CM | POA: Diagnosis not present

## 2020-10-01 DIAGNOSIS — F603 Borderline personality disorder: Secondary | ICD-10-CM | POA: Diagnosis not present

## 2020-10-01 DIAGNOSIS — F331 Major depressive disorder, recurrent, moderate: Secondary | ICD-10-CM | POA: Diagnosis not present

## 2020-10-01 DIAGNOSIS — F4310 Post-traumatic stress disorder, unspecified: Secondary | ICD-10-CM | POA: Diagnosis not present

## 2020-10-01 DIAGNOSIS — F509 Eating disorder, unspecified: Secondary | ICD-10-CM | POA: Diagnosis not present

## 2020-10-08 ENCOUNTER — Telehealth: Payer: Self-pay | Admitting: Podiatry

## 2020-10-08 NOTE — Telephone Encounter (Signed)
Called pt to schedule pick up of inserts no answer mailbox full.

## 2020-10-10 DIAGNOSIS — J029 Acute pharyngitis, unspecified: Secondary | ICD-10-CM | POA: Diagnosis not present

## 2020-10-10 DIAGNOSIS — H9203 Otalgia, bilateral: Secondary | ICD-10-CM | POA: Diagnosis not present

## 2020-10-10 DIAGNOSIS — B349 Viral infection, unspecified: Secondary | ICD-10-CM | POA: Diagnosis not present

## 2020-10-10 DIAGNOSIS — M791 Myalgia, unspecified site: Secondary | ICD-10-CM | POA: Diagnosis not present

## 2020-10-15 DIAGNOSIS — F331 Major depressive disorder, recurrent, moderate: Secondary | ICD-10-CM | POA: Diagnosis not present

## 2020-10-15 DIAGNOSIS — F603 Borderline personality disorder: Secondary | ICD-10-CM | POA: Diagnosis not present

## 2020-10-15 DIAGNOSIS — F4310 Post-traumatic stress disorder, unspecified: Secondary | ICD-10-CM | POA: Diagnosis not present

## 2020-10-15 DIAGNOSIS — F411 Generalized anxiety disorder: Secondary | ICD-10-CM | POA: Diagnosis not present

## 2020-10-15 DIAGNOSIS — F509 Eating disorder, unspecified: Secondary | ICD-10-CM | POA: Diagnosis not present

## 2020-10-29 DIAGNOSIS — F331 Major depressive disorder, recurrent, moderate: Secondary | ICD-10-CM | POA: Diagnosis not present

## 2020-10-29 DIAGNOSIS — F4310 Post-traumatic stress disorder, unspecified: Secondary | ICD-10-CM | POA: Diagnosis not present

## 2020-10-29 DIAGNOSIS — F509 Eating disorder, unspecified: Secondary | ICD-10-CM | POA: Diagnosis not present

## 2020-10-29 DIAGNOSIS — F603 Borderline personality disorder: Secondary | ICD-10-CM | POA: Diagnosis not present

## 2020-10-29 DIAGNOSIS — F411 Generalized anxiety disorder: Secondary | ICD-10-CM | POA: Diagnosis not present

## 2020-11-05 DIAGNOSIS — F411 Generalized anxiety disorder: Secondary | ICD-10-CM | POA: Diagnosis not present

## 2020-11-05 DIAGNOSIS — F331 Major depressive disorder, recurrent, moderate: Secondary | ICD-10-CM | POA: Diagnosis not present

## 2020-11-05 DIAGNOSIS — F4310 Post-traumatic stress disorder, unspecified: Secondary | ICD-10-CM | POA: Diagnosis not present

## 2020-11-05 DIAGNOSIS — F509 Eating disorder, unspecified: Secondary | ICD-10-CM | POA: Diagnosis not present

## 2020-11-05 DIAGNOSIS — F603 Borderline personality disorder: Secondary | ICD-10-CM | POA: Diagnosis not present

## 2020-11-13 ENCOUNTER — Ambulatory Visit: Payer: Medicaid Other | Admitting: Obstetrics and Gynecology

## 2020-11-14 ENCOUNTER — Ambulatory Visit: Payer: Medicaid Other | Admitting: Obstetrics and Gynecology

## 2020-11-19 DIAGNOSIS — F331 Major depressive disorder, recurrent, moderate: Secondary | ICD-10-CM | POA: Diagnosis not present

## 2020-11-19 DIAGNOSIS — F603 Borderline personality disorder: Secondary | ICD-10-CM | POA: Diagnosis not present

## 2020-11-19 DIAGNOSIS — F4310 Post-traumatic stress disorder, unspecified: Secondary | ICD-10-CM | POA: Diagnosis not present

## 2020-11-19 DIAGNOSIS — F411 Generalized anxiety disorder: Secondary | ICD-10-CM | POA: Diagnosis not present

## 2020-11-19 DIAGNOSIS — F509 Eating disorder, unspecified: Secondary | ICD-10-CM | POA: Diagnosis not present

## 2020-12-03 DIAGNOSIS — F603 Borderline personality disorder: Secondary | ICD-10-CM | POA: Diagnosis not present

## 2020-12-03 DIAGNOSIS — F331 Major depressive disorder, recurrent, moderate: Secondary | ICD-10-CM | POA: Diagnosis not present

## 2020-12-03 DIAGNOSIS — F411 Generalized anxiety disorder: Secondary | ICD-10-CM | POA: Diagnosis not present

## 2020-12-03 DIAGNOSIS — F4310 Post-traumatic stress disorder, unspecified: Secondary | ICD-10-CM | POA: Diagnosis not present

## 2020-12-03 DIAGNOSIS — F509 Eating disorder, unspecified: Secondary | ICD-10-CM | POA: Diagnosis not present

## 2020-12-10 DIAGNOSIS — F411 Generalized anxiety disorder: Secondary | ICD-10-CM | POA: Diagnosis not present

## 2020-12-10 DIAGNOSIS — F331 Major depressive disorder, recurrent, moderate: Secondary | ICD-10-CM | POA: Diagnosis not present

## 2020-12-10 DIAGNOSIS — F509 Eating disorder, unspecified: Secondary | ICD-10-CM | POA: Diagnosis not present

## 2020-12-10 DIAGNOSIS — F4310 Post-traumatic stress disorder, unspecified: Secondary | ICD-10-CM | POA: Diagnosis not present

## 2020-12-10 DIAGNOSIS — F603 Borderline personality disorder: Secondary | ICD-10-CM | POA: Diagnosis not present

## 2020-12-12 ENCOUNTER — Ambulatory Visit: Payer: Medicaid Other | Admitting: Obstetrics and Gynecology

## 2020-12-24 DIAGNOSIS — F331 Major depressive disorder, recurrent, moderate: Secondary | ICD-10-CM | POA: Diagnosis not present

## 2020-12-24 DIAGNOSIS — F4310 Post-traumatic stress disorder, unspecified: Secondary | ICD-10-CM | POA: Diagnosis not present

## 2020-12-24 DIAGNOSIS — F509 Eating disorder, unspecified: Secondary | ICD-10-CM | POA: Diagnosis not present

## 2020-12-24 DIAGNOSIS — F411 Generalized anxiety disorder: Secondary | ICD-10-CM | POA: Diagnosis not present

## 2020-12-24 DIAGNOSIS — F603 Borderline personality disorder: Secondary | ICD-10-CM | POA: Diagnosis not present

## 2020-12-30 DIAGNOSIS — G43009 Migraine without aura, not intractable, without status migrainosus: Secondary | ICD-10-CM | POA: Diagnosis not present

## 2021-01-07 DIAGNOSIS — F331 Major depressive disorder, recurrent, moderate: Secondary | ICD-10-CM | POA: Diagnosis not present

## 2021-01-07 DIAGNOSIS — F411 Generalized anxiety disorder: Secondary | ICD-10-CM | POA: Diagnosis not present

## 2021-01-07 DIAGNOSIS — F603 Borderline personality disorder: Secondary | ICD-10-CM | POA: Diagnosis not present

## 2021-01-07 DIAGNOSIS — F509 Eating disorder, unspecified: Secondary | ICD-10-CM | POA: Diagnosis not present

## 2021-01-07 DIAGNOSIS — F4310 Post-traumatic stress disorder, unspecified: Secondary | ICD-10-CM | POA: Diagnosis not present

## 2021-01-14 DIAGNOSIS — F411 Generalized anxiety disorder: Secondary | ICD-10-CM | POA: Diagnosis not present

## 2021-01-14 DIAGNOSIS — F509 Eating disorder, unspecified: Secondary | ICD-10-CM | POA: Diagnosis not present

## 2021-01-14 DIAGNOSIS — F331 Major depressive disorder, recurrent, moderate: Secondary | ICD-10-CM | POA: Diagnosis not present

## 2021-01-14 DIAGNOSIS — F603 Borderline personality disorder: Secondary | ICD-10-CM | POA: Diagnosis not present

## 2021-01-14 DIAGNOSIS — F4310 Post-traumatic stress disorder, unspecified: Secondary | ICD-10-CM | POA: Diagnosis not present

## 2021-01-21 ENCOUNTER — Encounter: Payer: Self-pay | Admitting: *Deleted
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
## 2021-01-26 ENCOUNTER — Ambulatory Visit: Payer: No Typology Code available for payment source | Admitting: Psychiatry

## 2021-01-27 ENCOUNTER — Encounter: Payer: Self-pay | Admitting: Psychiatry

## 2021-01-27 DIAGNOSIS — G43009 Migraine without aura, not intractable, without status migrainosus: Secondary | ICD-10-CM | POA: Diagnosis not present

## 2021-02-04 DIAGNOSIS — F603 Borderline personality disorder: Secondary | ICD-10-CM | POA: Diagnosis not present

## 2021-02-04 DIAGNOSIS — F411 Generalized anxiety disorder: Secondary | ICD-10-CM | POA: Diagnosis not present

## 2021-02-04 DIAGNOSIS — F4310 Post-traumatic stress disorder, unspecified: Secondary | ICD-10-CM | POA: Diagnosis not present

## 2021-02-04 DIAGNOSIS — F331 Major depressive disorder, recurrent, moderate: Secondary | ICD-10-CM | POA: Diagnosis not present

## 2021-02-04 DIAGNOSIS — F509 Eating disorder, unspecified: Secondary | ICD-10-CM | POA: Diagnosis not present

## 2021-03-18 DIAGNOSIS — F4310 Post-traumatic stress disorder, unspecified: Secondary | ICD-10-CM | POA: Diagnosis not present

## 2021-03-18 DIAGNOSIS — F603 Borderline personality disorder: Secondary | ICD-10-CM | POA: Diagnosis not present

## 2021-03-18 DIAGNOSIS — F509 Eating disorder, unspecified: Secondary | ICD-10-CM | POA: Diagnosis not present

## 2021-03-18 DIAGNOSIS — F411 Generalized anxiety disorder: Secondary | ICD-10-CM | POA: Diagnosis not present

## 2021-03-18 DIAGNOSIS — F331 Major depressive disorder, recurrent, moderate: Secondary | ICD-10-CM | POA: Diagnosis not present

## 2021-04-01 DIAGNOSIS — F603 Borderline personality disorder: Secondary | ICD-10-CM | POA: Diagnosis not present

## 2021-04-01 DIAGNOSIS — F331 Major depressive disorder, recurrent, moderate: Secondary | ICD-10-CM | POA: Diagnosis not present

## 2021-04-01 DIAGNOSIS — F4310 Post-traumatic stress disorder, unspecified: Secondary | ICD-10-CM | POA: Diagnosis not present

## 2021-04-01 DIAGNOSIS — F411 Generalized anxiety disorder: Secondary | ICD-10-CM | POA: Diagnosis not present

## 2021-04-01 DIAGNOSIS — F509 Eating disorder, unspecified: Secondary | ICD-10-CM | POA: Diagnosis not present

## 2021-04-08 DIAGNOSIS — F411 Generalized anxiety disorder: Secondary | ICD-10-CM | POA: Diagnosis not present

## 2021-04-08 DIAGNOSIS — F509 Eating disorder, unspecified: Secondary | ICD-10-CM | POA: Diagnosis not present

## 2021-04-08 DIAGNOSIS — F331 Major depressive disorder, recurrent, moderate: Secondary | ICD-10-CM | POA: Diagnosis not present

## 2021-04-08 DIAGNOSIS — F603 Borderline personality disorder: Secondary | ICD-10-CM | POA: Diagnosis not present

## 2021-04-08 DIAGNOSIS — F4310 Post-traumatic stress disorder, unspecified: Secondary | ICD-10-CM | POA: Diagnosis not present

## 2021-04-29 ENCOUNTER — Telehealth: Payer: Self-pay | Admitting: Podiatry

## 2021-04-29 DIAGNOSIS — F603 Borderline personality disorder: Secondary | ICD-10-CM | POA: Diagnosis not present

## 2021-04-29 DIAGNOSIS — F411 Generalized anxiety disorder: Secondary | ICD-10-CM | POA: Diagnosis not present

## 2021-04-29 DIAGNOSIS — F331 Major depressive disorder, recurrent, moderate: Secondary | ICD-10-CM | POA: Diagnosis not present

## 2021-04-29 DIAGNOSIS — F4310 Post-traumatic stress disorder, unspecified: Secondary | ICD-10-CM | POA: Diagnosis not present

## 2021-04-29 DIAGNOSIS — F509 Eating disorder, unspecified: Secondary | ICD-10-CM | POA: Diagnosis not present

## 2021-04-29 NOTE — Telephone Encounter (Signed)
Lvm for patient to call and schedule an appointment

## 2021-05-08 DIAGNOSIS — F331 Major depressive disorder, recurrent, moderate: Secondary | ICD-10-CM | POA: Diagnosis not present

## 2021-05-08 DIAGNOSIS — F603 Borderline personality disorder: Secondary | ICD-10-CM | POA: Diagnosis not present

## 2021-05-08 DIAGNOSIS — F4310 Post-traumatic stress disorder, unspecified: Secondary | ICD-10-CM | POA: Diagnosis not present

## 2021-05-08 DIAGNOSIS — F411 Generalized anxiety disorder: Secondary | ICD-10-CM | POA: Diagnosis not present

## 2021-05-08 DIAGNOSIS — F509 Eating disorder, unspecified: Secondary | ICD-10-CM | POA: Diagnosis not present

## 2021-05-15 ENCOUNTER — Other Ambulatory Visit: Payer: Medicaid Other

## 2021-06-02 DIAGNOSIS — Z309 Encounter for contraceptive management, unspecified: Secondary | ICD-10-CM | POA: Diagnosis not present

## 2021-06-05 DIAGNOSIS — F331 Major depressive disorder, recurrent, moderate: Secondary | ICD-10-CM | POA: Diagnosis not present

## 2021-06-05 DIAGNOSIS — F411 Generalized anxiety disorder: Secondary | ICD-10-CM | POA: Diagnosis not present

## 2021-06-05 DIAGNOSIS — F509 Eating disorder, unspecified: Secondary | ICD-10-CM | POA: Diagnosis not present

## 2021-06-05 DIAGNOSIS — F603 Borderline personality disorder: Secondary | ICD-10-CM | POA: Diagnosis not present

## 2021-06-05 DIAGNOSIS — F4310 Post-traumatic stress disorder, unspecified: Secondary | ICD-10-CM | POA: Diagnosis not present

## 2021-06-19 DIAGNOSIS — F4310 Post-traumatic stress disorder, unspecified: Secondary | ICD-10-CM | POA: Diagnosis not present

## 2021-06-19 DIAGNOSIS — F509 Eating disorder, unspecified: Secondary | ICD-10-CM | POA: Diagnosis not present

## 2021-06-19 DIAGNOSIS — F331 Major depressive disorder, recurrent, moderate: Secondary | ICD-10-CM | POA: Diagnosis not present

## 2021-06-19 DIAGNOSIS — F411 Generalized anxiety disorder: Secondary | ICD-10-CM | POA: Diagnosis not present

## 2021-06-19 DIAGNOSIS — F603 Borderline personality disorder: Secondary | ICD-10-CM | POA: Diagnosis not present

## 2021-06-30 ENCOUNTER — Ambulatory Visit: Payer: Medicaid Other | Admitting: Obstetrics

## 2021-07-15 DIAGNOSIS — F4310 Post-traumatic stress disorder, unspecified: Secondary | ICD-10-CM | POA: Diagnosis not present

## 2021-07-15 DIAGNOSIS — F331 Major depressive disorder, recurrent, moderate: Secondary | ICD-10-CM | POA: Diagnosis not present

## 2021-07-15 DIAGNOSIS — F603 Borderline personality disorder: Secondary | ICD-10-CM | POA: Diagnosis not present

## 2021-07-15 DIAGNOSIS — F411 Generalized anxiety disorder: Secondary | ICD-10-CM | POA: Diagnosis not present

## 2021-07-15 DIAGNOSIS — F509 Eating disorder, unspecified: Secondary | ICD-10-CM | POA: Diagnosis not present

## 2021-07-27 DIAGNOSIS — F509 Eating disorder, unspecified: Secondary | ICD-10-CM | POA: Diagnosis not present

## 2021-07-27 DIAGNOSIS — F331 Major depressive disorder, recurrent, moderate: Secondary | ICD-10-CM | POA: Diagnosis not present

## 2021-07-27 DIAGNOSIS — F411 Generalized anxiety disorder: Secondary | ICD-10-CM | POA: Diagnosis not present

## 2021-07-27 DIAGNOSIS — F603 Borderline personality disorder: Secondary | ICD-10-CM | POA: Diagnosis not present

## 2021-07-27 DIAGNOSIS — F4310 Post-traumatic stress disorder, unspecified: Secondary | ICD-10-CM | POA: Diagnosis not present

## 2021-08-14 DIAGNOSIS — F603 Borderline personality disorder: Secondary | ICD-10-CM | POA: Diagnosis not present

## 2021-08-14 DIAGNOSIS — F4310 Post-traumatic stress disorder, unspecified: Secondary | ICD-10-CM | POA: Diagnosis not present

## 2021-08-14 DIAGNOSIS — F509 Eating disorder, unspecified: Secondary | ICD-10-CM | POA: Diagnosis not present

## 2021-08-14 DIAGNOSIS — F331 Major depressive disorder, recurrent, moderate: Secondary | ICD-10-CM | POA: Diagnosis not present

## 2021-08-14 DIAGNOSIS — F411 Generalized anxiety disorder: Secondary | ICD-10-CM | POA: Diagnosis not present

## 2021-08-27 DIAGNOSIS — F331 Major depressive disorder, recurrent, moderate: Secondary | ICD-10-CM | POA: Diagnosis not present

## 2021-08-27 DIAGNOSIS — F4310 Post-traumatic stress disorder, unspecified: Secondary | ICD-10-CM | POA: Diagnosis not present

## 2021-08-27 DIAGNOSIS — F603 Borderline personality disorder: Secondary | ICD-10-CM | POA: Diagnosis not present

## 2021-08-27 DIAGNOSIS — F411 Generalized anxiety disorder: Secondary | ICD-10-CM | POA: Diagnosis not present

## 2021-08-27 DIAGNOSIS — F509 Eating disorder, unspecified: Secondary | ICD-10-CM | POA: Diagnosis not present

## 2021-09-11 DIAGNOSIS — F331 Major depressive disorder, recurrent, moderate: Secondary | ICD-10-CM | POA: Diagnosis not present

## 2021-09-11 DIAGNOSIS — F603 Borderline personality disorder: Secondary | ICD-10-CM | POA: Diagnosis not present

## 2021-09-11 DIAGNOSIS — F411 Generalized anxiety disorder: Secondary | ICD-10-CM | POA: Diagnosis not present

## 2021-09-11 DIAGNOSIS — F509 Eating disorder, unspecified: Secondary | ICD-10-CM | POA: Diagnosis not present

## 2021-09-11 DIAGNOSIS — F4310 Post-traumatic stress disorder, unspecified: Secondary | ICD-10-CM | POA: Diagnosis not present
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
## 2021-10-02 DIAGNOSIS — F411 Generalized anxiety disorder: Secondary | ICD-10-CM | POA: Diagnosis not present

## 2021-10-02 DIAGNOSIS — F603 Borderline personality disorder: Secondary | ICD-10-CM | POA: Diagnosis not present

## 2021-10-02 DIAGNOSIS — F509 Eating disorder, unspecified: Secondary | ICD-10-CM | POA: Diagnosis not present

## 2021-10-02 DIAGNOSIS — F4310 Post-traumatic stress disorder, unspecified: Secondary | ICD-10-CM | POA: Diagnosis not present

## 2021-10-02 DIAGNOSIS — F331 Major depressive disorder, recurrent, moderate: Secondary | ICD-10-CM | POA: Diagnosis not present

## 2021-10-09 ENCOUNTER — Other Ambulatory Visit: Payer: Medicaid Other

## 2021-10-13 DIAGNOSIS — E669 Obesity, unspecified: Secondary | ICD-10-CM | POA: Diagnosis not present

## 2021-10-13 DIAGNOSIS — M2550 Pain in unspecified joint: Secondary | ICD-10-CM | POA: Diagnosis not present

## 2021-10-13 DIAGNOSIS — R140 Abdominal distension (gaseous): Secondary | ICD-10-CM | POA: Diagnosis not present

## 2021-10-23 DIAGNOSIS — F509 Eating disorder, unspecified: Secondary | ICD-10-CM | POA: Diagnosis not present

## 2021-10-23 DIAGNOSIS — F331 Major depressive disorder, recurrent, moderate: Secondary | ICD-10-CM | POA: Diagnosis not present

## 2021-10-23 DIAGNOSIS — F603 Borderline personality disorder: Secondary | ICD-10-CM | POA: Diagnosis not present

## 2021-10-23 DIAGNOSIS — F4310 Post-traumatic stress disorder, unspecified: Secondary | ICD-10-CM | POA: Diagnosis not present

## 2021-10-23 DIAGNOSIS — F411 Generalized anxiety disorder: Secondary | ICD-10-CM | POA: Diagnosis not present

## 2021-11-20 DIAGNOSIS — F603 Borderline personality disorder: Secondary | ICD-10-CM | POA: Diagnosis not present

## 2021-11-20 DIAGNOSIS — F331 Major depressive disorder, recurrent, moderate: Secondary | ICD-10-CM | POA: Diagnosis not present

## 2021-11-20 DIAGNOSIS — F411 Generalized anxiety disorder: Secondary | ICD-10-CM | POA: Diagnosis not present

## 2021-11-20 DIAGNOSIS — F509 Eating disorder, unspecified: Secondary | ICD-10-CM | POA: Diagnosis not present

## 2021-11-20 DIAGNOSIS — F4310 Post-traumatic stress disorder, unspecified: Secondary | ICD-10-CM | POA: Diagnosis not present

## 2021-12-04 DIAGNOSIS — F603 Borderline personality disorder: Secondary | ICD-10-CM | POA: Diagnosis not present

## 2021-12-04 DIAGNOSIS — F4310 Post-traumatic stress disorder, unspecified: Secondary | ICD-10-CM | POA: Diagnosis not present

## 2021-12-04 DIAGNOSIS — F411 Generalized anxiety disorder: Secondary | ICD-10-CM | POA: Diagnosis not present

## 2021-12-04 DIAGNOSIS — F509 Eating disorder, unspecified: Secondary | ICD-10-CM | POA: Diagnosis not present

## 2021-12-04 DIAGNOSIS — F331 Major depressive disorder, recurrent, moderate: Secondary | ICD-10-CM | POA: Diagnosis not present

## 2021-12-18 DIAGNOSIS — F4310 Post-traumatic stress disorder, unspecified: Secondary | ICD-10-CM | POA: Diagnosis not present

## 2021-12-18 DIAGNOSIS — F509 Eating disorder, unspecified: Secondary | ICD-10-CM | POA: Diagnosis not present

## 2021-12-18 DIAGNOSIS — F603 Borderline personality disorder: Secondary | ICD-10-CM | POA: Diagnosis not present

## 2021-12-18 DIAGNOSIS — F411 Generalized anxiety disorder: Secondary | ICD-10-CM | POA: Diagnosis not present

## 2021-12-18 DIAGNOSIS — F331 Major depressive disorder, recurrent, moderate: Secondary | ICD-10-CM | POA: Diagnosis not present

## 2022-01-20 DIAGNOSIS — H52223 Regular astigmatism, bilateral: Secondary | ICD-10-CM | POA: Diagnosis not present

## 2022-01-28 DIAGNOSIS — F331 Major depressive disorder, recurrent, moderate: Secondary | ICD-10-CM | POA: Diagnosis not present

## 2022-01-28 DIAGNOSIS — F603 Borderline personality disorder: Secondary | ICD-10-CM | POA: Diagnosis not present

## 2022-01-28 DIAGNOSIS — F4310 Post-traumatic stress disorder, unspecified: Secondary | ICD-10-CM | POA: Diagnosis not present

## 2022-01-28 DIAGNOSIS — F411 Generalized anxiety disorder: Secondary | ICD-10-CM | POA: Diagnosis not present

## 2022-01-28 DIAGNOSIS — F509 Eating disorder, unspecified: Secondary | ICD-10-CM | POA: Diagnosis not present

## 2022-02-19 DIAGNOSIS — F4310 Post-traumatic stress disorder, unspecified: Secondary | ICD-10-CM | POA: Diagnosis not present

## 2022-02-19 DIAGNOSIS — F603 Borderline personality disorder: Secondary | ICD-10-CM | POA: Diagnosis not present

## 2022-02-19 DIAGNOSIS — F331 Major depressive disorder, recurrent, moderate: Secondary | ICD-10-CM | POA: Diagnosis not present

## 2022-02-19 DIAGNOSIS — F411 Generalized anxiety disorder: Secondary | ICD-10-CM | POA: Diagnosis not present

## 2022-02-19 DIAGNOSIS — F509 Eating disorder, unspecified: Secondary | ICD-10-CM | POA: Diagnosis not present

## 2022-03-19 DIAGNOSIS — F331 Major depressive disorder, recurrent, moderate: Secondary | ICD-10-CM | POA: Diagnosis not present

## 2022-03-19 DIAGNOSIS — F411 Generalized anxiety disorder: Secondary | ICD-10-CM | POA: Diagnosis not present

## 2022-03-19 DIAGNOSIS — F4310 Post-traumatic stress disorder, unspecified: Secondary | ICD-10-CM | POA: Diagnosis not present

## 2022-03-19 DIAGNOSIS — F509 Eating disorder, unspecified: Secondary | ICD-10-CM | POA: Diagnosis not present

## 2022-03-19 DIAGNOSIS — F603 Borderline personality disorder: Secondary | ICD-10-CM | POA: Diagnosis not present

## 2022-04-30 DIAGNOSIS — R102 Pelvic and perineal pain: Secondary | ICD-10-CM | POA: Diagnosis not present

## 2022-04-30 DIAGNOSIS — G8929 Other chronic pain: Secondary | ICD-10-CM | POA: Diagnosis not present

## 2022-06-21 DIAGNOSIS — N941 Unspecified dyspareunia: Secondary | ICD-10-CM | POA: Diagnosis not present

## 2022-06-21 DIAGNOSIS — F603 Borderline personality disorder: Secondary | ICD-10-CM | POA: Diagnosis not present

## 2022-06-21 DIAGNOSIS — F4310 Post-traumatic stress disorder, unspecified: Secondary | ICD-10-CM | POA: Diagnosis not present

## 2022-06-21 DIAGNOSIS — F331 Major depressive disorder, recurrent, moderate: Secondary | ICD-10-CM | POA: Diagnosis not present

## 2022-06-21 DIAGNOSIS — F509 Eating disorder, unspecified: Secondary | ICD-10-CM | POA: Diagnosis not present

## 2022-06-21 DIAGNOSIS — M545 Low back pain, unspecified: Secondary | ICD-10-CM | POA: Diagnosis not present

## 2022-06-21 DIAGNOSIS — G8929 Other chronic pain: Secondary | ICD-10-CM | POA: Diagnosis not present

## 2022-06-21 DIAGNOSIS — F411 Generalized anxiety disorder: Secondary | ICD-10-CM | POA: Diagnosis not present

## 2022-06-21 DIAGNOSIS — R1030 Lower abdominal pain, unspecified: Secondary | ICD-10-CM | POA: Diagnosis not present

## 2022-06-21 DIAGNOSIS — M6289 Other specified disorders of muscle: Secondary | ICD-10-CM | POA: Diagnosis not present

## 2022-06-21 DIAGNOSIS — M62838 Other muscle spasm: Secondary | ICD-10-CM | POA: Diagnosis not present

## 2022-07-30 DIAGNOSIS — M545 Low back pain, unspecified: Secondary | ICD-10-CM | POA: Diagnosis not present

## 2022-07-30 DIAGNOSIS — N941 Unspecified dyspareunia: Secondary | ICD-10-CM | POA: Diagnosis not present

## 2022-07-30 DIAGNOSIS — R1030 Lower abdominal pain, unspecified: Secondary | ICD-10-CM | POA: Diagnosis not present

## 2022-07-30 DIAGNOSIS — M6289 Other specified disorders of muscle: Secondary | ICD-10-CM | POA: Diagnosis not present

## 2022-07-30 DIAGNOSIS — G8929 Other chronic pain: Secondary | ICD-10-CM | POA: Diagnosis not present

## 2022-07-30 DIAGNOSIS — M62838 Other muscle spasm: Secondary | ICD-10-CM | POA: Diagnosis not present

## 2022-08-02 DIAGNOSIS — M6289 Other specified disorders of muscle: Secondary | ICD-10-CM | POA: Diagnosis not present

## 2022-08-02 DIAGNOSIS — M545 Low back pain, unspecified: Secondary | ICD-10-CM | POA: Diagnosis not present

## 2022-08-02 DIAGNOSIS — N941 Unspecified dyspareunia: Secondary | ICD-10-CM | POA: Diagnosis not present

## 2022-08-02 DIAGNOSIS — G8929 Other chronic pain: Secondary | ICD-10-CM | POA: Diagnosis not present

## 2022-08-02 DIAGNOSIS — R1030 Lower abdominal pain, unspecified: Secondary | ICD-10-CM | POA: Diagnosis not present

## 2022-08-02 DIAGNOSIS — M62838 Other muscle spasm: Secondary | ICD-10-CM | POA: Diagnosis not present

## 2022-09-02 DIAGNOSIS — L03211 Cellulitis of face: Secondary | ICD-10-CM | POA: Diagnosis not present

## 2022-09-07 DIAGNOSIS — M25512 Pain in left shoulder: Secondary | ICD-10-CM | POA: Diagnosis not present

## 2022-09-07 DIAGNOSIS — G8929 Other chronic pain: Secondary | ICD-10-CM | POA: Diagnosis not present

## 2022-09-07 DIAGNOSIS — M25861 Other specified joint disorders, right knee: Secondary | ICD-10-CM | POA: Diagnosis not present

## 2022-09-07 DIAGNOSIS — M25561 Pain in right knee: Secondary | ICD-10-CM | POA: Diagnosis not present

## 2022-09-07 DIAGNOSIS — M25862 Other specified joint disorders, left knee: Secondary | ICD-10-CM | POA: Diagnosis not present

## 2022-09-07 DIAGNOSIS — M25361 Other instability, right knee: Secondary | ICD-10-CM | POA: Diagnosis not present

## 2022-09-07 DIAGNOSIS — M25562 Pain in left knee: Secondary | ICD-10-CM | POA: Diagnosis not present

## 2022-09-07 DIAGNOSIS — M25362 Other instability, left knee: Secondary | ICD-10-CM | POA: Diagnosis not present

## 2022-10-26 DIAGNOSIS — F331 Major depressive disorder, recurrent, moderate: Secondary | ICD-10-CM | POA: Diagnosis not present

## 2022-10-26 DIAGNOSIS — F4310 Post-traumatic stress disorder, unspecified: Secondary | ICD-10-CM | POA: Diagnosis not present

## 2022-10-26 DIAGNOSIS — F509 Eating disorder, unspecified: Secondary | ICD-10-CM | POA: Diagnosis not present

## 2022-10-26 DIAGNOSIS — F411 Generalized anxiety disorder: Secondary | ICD-10-CM | POA: Diagnosis not present

## 2022-10-26 DIAGNOSIS — F603 Borderline personality disorder: Secondary | ICD-10-CM | POA: Diagnosis not present

## 2022-12-02 DIAGNOSIS — F509 Eating disorder, unspecified: Secondary | ICD-10-CM | POA: Diagnosis not present

## 2022-12-02 DIAGNOSIS — F4310 Post-traumatic stress disorder, unspecified: Secondary | ICD-10-CM | POA: Diagnosis not present

## 2022-12-02 DIAGNOSIS — F603 Borderline personality disorder: Secondary | ICD-10-CM | POA: Diagnosis not present

## 2022-12-02 DIAGNOSIS — F331 Major depressive disorder, recurrent, moderate: Secondary | ICD-10-CM | POA: Diagnosis not present

## 2022-12-02 DIAGNOSIS — F411 Generalized anxiety disorder: Secondary | ICD-10-CM | POA: Diagnosis not present

## 2022-12-30 DIAGNOSIS — H52223 Regular astigmatism, bilateral: Secondary | ICD-10-CM | POA: Diagnosis not present

## 2023-01-05 DIAGNOSIS — F411 Generalized anxiety disorder: Secondary | ICD-10-CM | POA: Diagnosis not present

## 2023-01-05 DIAGNOSIS — F603 Borderline personality disorder: Secondary | ICD-10-CM | POA: Diagnosis not present

## 2023-01-05 DIAGNOSIS — F4310 Post-traumatic stress disorder, unspecified: Secondary | ICD-10-CM | POA: Diagnosis not present

## 2023-01-05 DIAGNOSIS — F509 Eating disorder, unspecified: Secondary | ICD-10-CM | POA: Diagnosis not present

## 2023-01-05 DIAGNOSIS — F331 Major depressive disorder, recurrent, moderate: Secondary | ICD-10-CM | POA: Diagnosis not present

## 2023-01-19 DIAGNOSIS — F4310 Post-traumatic stress disorder, unspecified: Secondary | ICD-10-CM | POA: Diagnosis not present

## 2023-01-19 DIAGNOSIS — F331 Major depressive disorder, recurrent, moderate: Secondary | ICD-10-CM | POA: Diagnosis not present

## 2023-01-19 DIAGNOSIS — F411 Generalized anxiety disorder: Secondary | ICD-10-CM | POA: Diagnosis not present

## 2023-01-19 DIAGNOSIS — F603 Borderline personality disorder: Secondary | ICD-10-CM | POA: Diagnosis not present

## 2023-01-19 DIAGNOSIS — F509 Eating disorder, unspecified: Secondary | ICD-10-CM | POA: Diagnosis not present

## 2023-02-24 DIAGNOSIS — R059 Cough, unspecified: Secondary | ICD-10-CM | POA: Diagnosis not present

## 2023-02-24 DIAGNOSIS — J40 Bronchitis, not specified as acute or chronic: Secondary | ICD-10-CM | POA: Diagnosis not present

## 2023-03-02 DIAGNOSIS — F509 Eating disorder, unspecified: Secondary | ICD-10-CM | POA: Diagnosis not present

## 2023-03-02 DIAGNOSIS — F411 Generalized anxiety disorder: Secondary | ICD-10-CM | POA: Diagnosis not present

## 2023-03-02 DIAGNOSIS — F603 Borderline personality disorder: Secondary | ICD-10-CM | POA: Diagnosis not present

## 2023-03-02 DIAGNOSIS — F331 Major depressive disorder, recurrent, moderate: Secondary | ICD-10-CM | POA: Diagnosis not present

## 2023-03-02 DIAGNOSIS — F4310 Post-traumatic stress disorder, unspecified: Secondary | ICD-10-CM | POA: Diagnosis not present

## 2023-03-05 DIAGNOSIS — G43E11 Chronic migraine with aura, intractable, with status migrainosus: Secondary | ICD-10-CM | POA: Diagnosis not present

## 2023-04-07 DIAGNOSIS — Z3009 Encounter for other general counseling and advice on contraception: Secondary | ICD-10-CM | POA: Diagnosis not present

## 2023-04-08 DIAGNOSIS — F411 Generalized anxiety disorder: Secondary | ICD-10-CM | POA: Diagnosis not present

## 2023-04-08 DIAGNOSIS — F4310 Post-traumatic stress disorder, unspecified: Secondary | ICD-10-CM | POA: Diagnosis not present

## 2023-04-08 DIAGNOSIS — F603 Borderline personality disorder: Secondary | ICD-10-CM | POA: Diagnosis not present

## 2023-04-08 DIAGNOSIS — F331 Major depressive disorder, recurrent, moderate: Secondary | ICD-10-CM | POA: Diagnosis not present

## 2023-05-10 DIAGNOSIS — F331 Major depressive disorder, recurrent, moderate: Secondary | ICD-10-CM | POA: Diagnosis not present

## 2023-05-10 DIAGNOSIS — F4310 Post-traumatic stress disorder, unspecified: Secondary | ICD-10-CM | POA: Diagnosis not present

## 2023-05-10 DIAGNOSIS — F509 Eating disorder, unspecified: Secondary | ICD-10-CM | POA: Diagnosis not present

## 2023-05-10 DIAGNOSIS — F603 Borderline personality disorder: Secondary | ICD-10-CM | POA: Diagnosis not present

## 2023-05-10 DIAGNOSIS — F411 Generalized anxiety disorder: Secondary | ICD-10-CM | POA: Diagnosis not present

## 2023-05-19 DIAGNOSIS — F4310 Post-traumatic stress disorder, unspecified: Secondary | ICD-10-CM | POA: Diagnosis not present

## 2023-05-19 DIAGNOSIS — Z6841 Body Mass Index (BMI) 40.0 and over, adult: Secondary | ICD-10-CM | POA: Diagnosis not present

## 2023-05-19 DIAGNOSIS — Z79899 Other long term (current) drug therapy: Secondary | ICD-10-CM | POA: Diagnosis not present

## 2023-05-19 DIAGNOSIS — E282 Polycystic ovarian syndrome: Secondary | ICD-10-CM | POA: Diagnosis not present

## 2023-05-19 DIAGNOSIS — Z87442 Personal history of urinary calculi: Secondary | ICD-10-CM | POA: Diagnosis not present

## 2023-05-19 DIAGNOSIS — Z888 Allergy status to other drugs, medicaments and biological substances status: Secondary | ICD-10-CM | POA: Diagnosis not present

## 2023-05-19 DIAGNOSIS — Z881 Allergy status to other antibiotic agents status: Secondary | ICD-10-CM | POA: Diagnosis not present

## 2023-05-19 DIAGNOSIS — Z9889 Other specified postprocedural states: Secondary | ICD-10-CM | POA: Diagnosis not present

## 2023-05-19 DIAGNOSIS — Z302 Encounter for sterilization: Secondary | ICD-10-CM | POA: Diagnosis not present

## 2023-06-16 DIAGNOSIS — E282 Polycystic ovarian syndrome: Secondary | ICD-10-CM | POA: Diagnosis not present

## 2023-06-16 DIAGNOSIS — L739 Follicular disorder, unspecified: Secondary | ICD-10-CM | POA: Diagnosis not present

## 2023-06-16 DIAGNOSIS — Z9889 Other specified postprocedural states: Secondary | ICD-10-CM | POA: Diagnosis not present

## 2023-07-05 DIAGNOSIS — F509 Eating disorder, unspecified: Secondary | ICD-10-CM | POA: Diagnosis not present

## 2023-07-05 DIAGNOSIS — F331 Major depressive disorder, recurrent, moderate: Secondary | ICD-10-CM | POA: Diagnosis not present

## 2023-07-05 DIAGNOSIS — F603 Borderline personality disorder: Secondary | ICD-10-CM | POA: Diagnosis not present

## 2023-07-05 DIAGNOSIS — F4310 Post-traumatic stress disorder, unspecified: Secondary | ICD-10-CM | POA: Diagnosis not present

## 2023-07-05 DIAGNOSIS — F411 Generalized anxiety disorder: Secondary | ICD-10-CM | POA: Diagnosis not present

## 2023-07-09 DIAGNOSIS — G43001 Migraine without aura, not intractable, with status migrainosus: Secondary | ICD-10-CM | POA: Diagnosis not present

## 2023-08-12 DIAGNOSIS — F411 Generalized anxiety disorder: Secondary | ICD-10-CM | POA: Diagnosis not present

## 2023-08-12 DIAGNOSIS — F331 Major depressive disorder, recurrent, moderate: Secondary | ICD-10-CM | POA: Diagnosis not present

## 2023-08-15 DIAGNOSIS — J029 Acute pharyngitis, unspecified: Secondary | ICD-10-CM | POA: Diagnosis not present

## 2023-08-15 DIAGNOSIS — B2790 Infectious mononucleosis, unspecified without complication: Secondary | ICD-10-CM | POA: Diagnosis not present

## 2023-08-19 DIAGNOSIS — L509 Urticaria, unspecified: Secondary | ICD-10-CM | POA: Diagnosis not present

## 2023-08-20 DIAGNOSIS — T7849XA Other allergy, initial encounter: Secondary | ICD-10-CM | POA: Diagnosis not present

## 2023-09-02 ENCOUNTER — Telehealth: Payer: Self-pay

## 2023-09-02 DIAGNOSIS — G43909 Migraine, unspecified, not intractable, without status migrainosus: Secondary | ICD-10-CM

## 2023-09-02 NOTE — Progress Notes (Unsigned)
 Complex Care Management Note Care Guide Note  09/02/2023 Name: Andrea Green MRN: [id number] DOB: April 20, 2000   Complex Care Management Outreach Attempts: An unsuccessful telephone outreach was attempted today to offer the patient information about available complex care management services.  Follow Up Plan:  Additional outreach attempts will be made to offer the patient complex care management information and services.   Encounter Outcome:  No Answer  Creola Doheny [facility], [facility] Guide  Direct Dial: [phone]  Fax [phone]

## 2023-09-05 NOTE — Progress Notes (Signed)
 Complex Care Management Note Care Guide Note  09/05/2023 Name: Andrea Green MRN: [id number] DOB: [birth year]   Complex Care Management Outreach Attempts: A second unsuccessful outreach was attempted today to offer the patient with information about available complex care management services.  Follow Up Plan:  Additional outreach attempts will be made to offer the patient complex care management information and services.   Encounter Outcome:  No Answer  Leotis Rase [facility], [place] Guide  Direct Dial: [phone]  Fax [phone]

## 2023-09-05 NOTE — Progress Notes (Signed)
 Complex Care Management Note  Care Guide Note 09/05/2023 Name: Edell Mesenbrink MRN: [id number] DOB: 2000/09/09  Jordain Radin is a 23 y.o. year old female who sees [facility] for primary care. I reached out to Gavin Bacigalupi by phone today to offer complex care management services.  Ms. Kilroy was given information about Complex Care Management services today including:   The Complex Care Management services include support from the care team which includes your Nurse Care Manager, Clinical Social Worker, or Pharmacist.  The Complex Care Management team is here to help remove barriers to the health concerns and goals most important to you. Complex Care Management services are voluntary, and the patient may decline or stop services at any time by request to their care team member.   Complex Care Management Consent Status: Patient did not agree to participate in complex care management services at this time.  Follow up plan:  Unsuccessful telephone outreach attempt made. A HIPAA compliant phone message was left for the patient providing contact information and requesting a return call.  Encounter Outcome:  Patient Refused  Leotis Rase [facility], [facility] Guide  Direct Dial: [phone]  Fax [phone]

## 2023-09-14 DIAGNOSIS — F331 Major depressive disorder, recurrent, moderate: Secondary | ICD-10-CM | POA: Diagnosis not present

## 2023-09-14 DIAGNOSIS — F411 Generalized anxiety disorder: Secondary | ICD-10-CM | POA: Diagnosis not present

## 2023-10-21 DIAGNOSIS — R000 Tachycardia, unspecified: Secondary | ICD-10-CM | POA: Diagnosis not present

## 2023-10-21 DIAGNOSIS — R002 Palpitations: Secondary | ICD-10-CM | POA: Diagnosis not present

## 2023-10-21 DIAGNOSIS — R0609 Other forms of dyspnea: Secondary | ICD-10-CM | POA: Diagnosis not present

## 2023-11-03 DIAGNOSIS — F4310 Post-traumatic stress disorder, unspecified: Secondary | ICD-10-CM | POA: Diagnosis not present

## 2023-11-03 DIAGNOSIS — F603 Borderline personality disorder: Secondary | ICD-10-CM | POA: Diagnosis not present

## 2023-11-03 DIAGNOSIS — F411 Generalized anxiety disorder: Secondary | ICD-10-CM | POA: Diagnosis not present

## 2023-11-03 DIAGNOSIS — F331 Major depressive disorder, recurrent, moderate: Secondary | ICD-10-CM | POA: Diagnosis not present

## 2023-11-03 DIAGNOSIS — F509 Eating disorder, unspecified: Secondary | ICD-10-CM | POA: Diagnosis not present

## 2023-11-27 DIAGNOSIS — L508 Other urticaria: Secondary | ICD-10-CM | POA: Diagnosis not present

## 2023-11-27 DIAGNOSIS — L501 Idiopathic urticaria: Secondary | ICD-10-CM | POA: Diagnosis not present

## 2023-12-16 DIAGNOSIS — R000 Tachycardia, unspecified: Secondary | ICD-10-CM | POA: Diagnosis not present

## 2023-12-16 DIAGNOSIS — Z8249 Family history of ischemic heart disease and other diseases of the circulatory system: Secondary | ICD-10-CM | POA: Diagnosis not present

## 2023-12-16 DIAGNOSIS — R002 Palpitations: Secondary | ICD-10-CM | POA: Diagnosis not present

## 2023-12-16 DIAGNOSIS — R0609 Other forms of dyspnea: Secondary | ICD-10-CM | POA: Diagnosis not present
